# Patient Record
Sex: Male | Born: 1970 | Hispanic: No | Marital: Married | Smoking: Current some day smoker
Health system: Southern US, Community
[De-identification: ages and names within clinical notes are randomized; demographics above are authoritative.]

## PROBLEM LIST (undated history)

## (undated) VITALS — BP 109/74 | HR 75 | Temp 97.6°F | Resp 20 | Ht 73.0 in | Wt 178.0 lb

## (undated) DIAGNOSIS — R208 Other disturbances of skin sensation: Secondary | ICD-10-CM

## (undated) DIAGNOSIS — F329 Major depressive disorder, single episode, unspecified: Secondary | ICD-10-CM

## (undated) DIAGNOSIS — J45909 Unspecified asthma, uncomplicated: Secondary | ICD-10-CM

## (undated) DIAGNOSIS — M79672 Pain in left foot: Secondary | ICD-10-CM

## (undated) DIAGNOSIS — G8929 Other chronic pain: Secondary | ICD-10-CM

## (undated) DIAGNOSIS — F419 Anxiety disorder, unspecified: Secondary | ICD-10-CM

## (undated) DIAGNOSIS — F32A Depression, unspecified: Secondary | ICD-10-CM

## (undated) DIAGNOSIS — L409 Psoriasis, unspecified: Secondary | ICD-10-CM

## (undated) DIAGNOSIS — J302 Other seasonal allergic rhinitis: Secondary | ICD-10-CM

## (undated) DIAGNOSIS — R200 Anesthesia of skin: Secondary | ICD-10-CM

## (undated) HISTORY — DX: Anesthesia of skin: R20.0

## (undated) HISTORY — DX: Other chronic pain: G89.29

## (undated) HISTORY — DX: Psoriasis, unspecified: L40.9

## (undated) HISTORY — PX: HERNIA REPAIR: SHX51

## (undated) HISTORY — DX: Other disturbances of skin sensation: R20.8

## (undated) HISTORY — DX: Pain in left foot: M79.672

## (undated) HISTORY — DX: Other seasonal allergic rhinitis: J30.2

---

## 2002-02-23 ENCOUNTER — Ambulatory Visit (HOSPITAL_COMMUNITY): Admission: RE | Admit: 2002-02-23 | Discharge: 2002-02-23 | Payer: Self-pay | Admitting: Surgery

## 2003-02-08 ENCOUNTER — Encounter: Admission: RE | Admit: 2003-02-08 | Discharge: 2003-02-08 | Payer: Self-pay | Admitting: Surgery

## 2003-02-20 ENCOUNTER — Encounter: Admission: RE | Admit: 2003-02-20 | Discharge: 2003-02-20 | Payer: Self-pay | Admitting: Surgery

## 2005-04-08 IMAGING — CT CT PELVIS W/ CM
1 of 2 series · 15 of 32 positions shown, 19 images · IV contrast (gastro & omnipaque 100)
Comparison: none

CLINICAL DATA: Left abdominal pain ? question kidney stone vs. diverticulitis.
TECHNIQUE: The abdomen was scanned initially without contrast.  Then, the abdomen and pelvis were scanned with contrast (100 cc Omnipaque 300 ? nonionic contrast used because of history of asthma).  Routine delayed renal images were carried out. 
 CT ABDOMEN WITHOUT AND WITH CONTRAST
 No renal calculi.  No hydronephrosis or hydroureter.  Lung bases clear.  Liver, spleen, pancreas, and adrenal glands normal.  No calcified gallstones or biliary dilatation.  No adenopathy or ascites.  
 IMPRESSION
 Normal ? specifically, no evidence for urinary tract calculi.
 CT PELVIS WITH CONTRAST
 There are inflammatory changes of the descending colon near its junction with the sigmoid.  There is marked thickening of the anterior wall of the bowel with stranding of the pericolonic fat.  Within the area of thickening, there is a lucency that could be an early abscess.  However, its Hounsfield units are in the negative range, suggesting that this may just be mesenteric fat surrounded by an inflammatory process.  Most likely diagnosis is diverticulitis, however, I cannot say that I see diverticula elsewhere in the colon.  Nonetheless, that is the most likely diagnosis.  There are no changes elsewhere to suggest inflammatory bowel disease.
 No other focal masses, adenopathy, or fluid collections. 
 Focal inflammatory changes involving the distal descending colon near its junction with the sigmoid.  Most likely diagnosis is diverticulitis.  See above discussion.

[Series 3: — · axial · 0.70mm/px · z∈[-476,-91]mm · 15 of 113 slices shown, 19 images]
[im 5/113  soft-tissue]
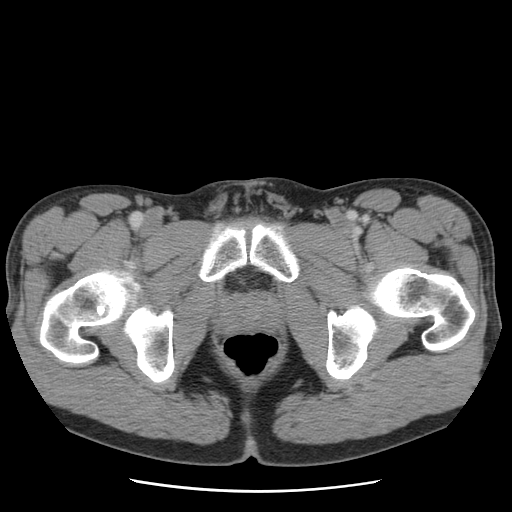
[im 5/113  bone]
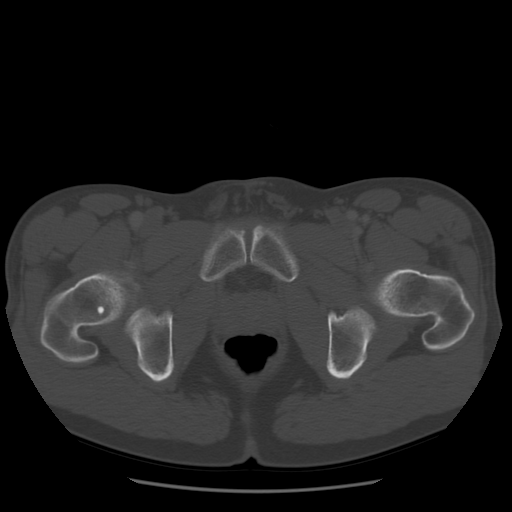
[im 15/113  soft-tissue]
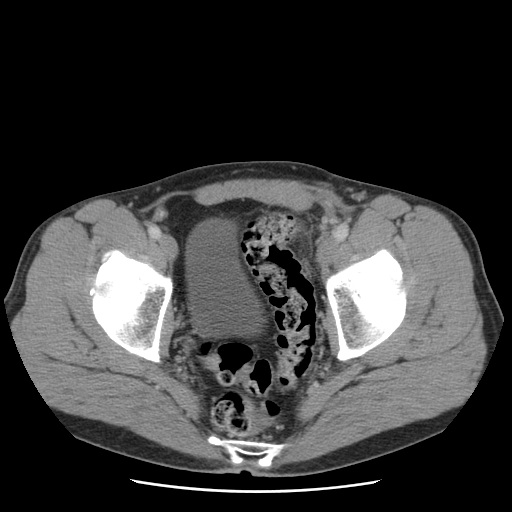
[im 24/113  soft-tissue]
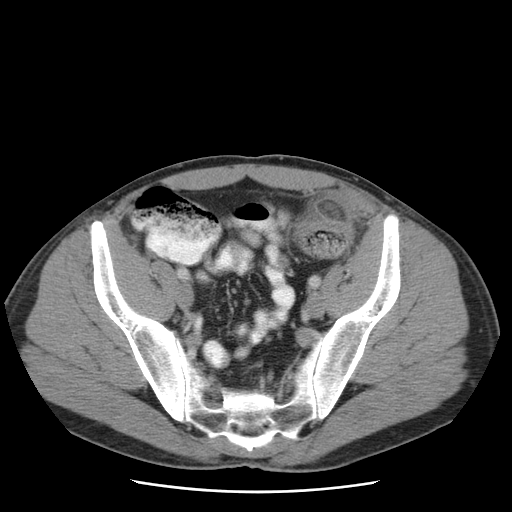
[im 33/113  soft-tissue]
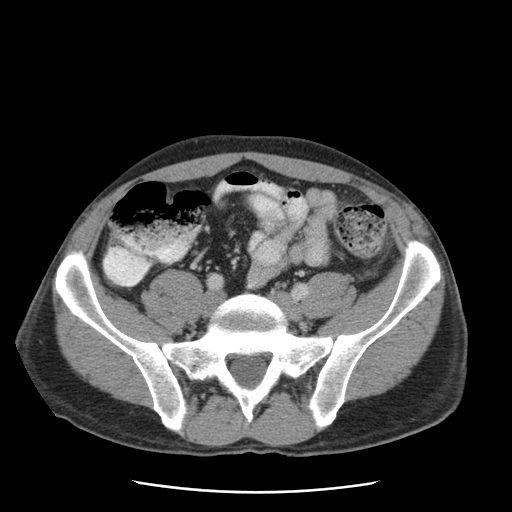
[im 38/113  soft-tissue]
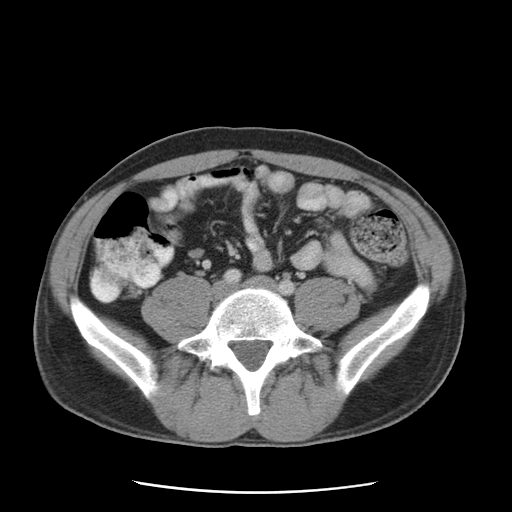
[im 47/113  soft-tissue]
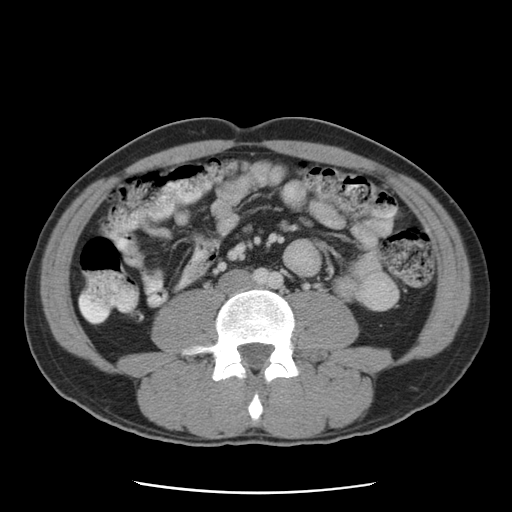
[im 57/113  soft-tissue]
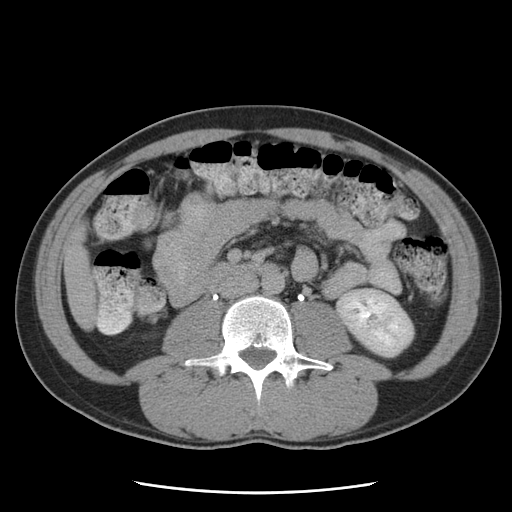
[im 66/113  soft-tissue]
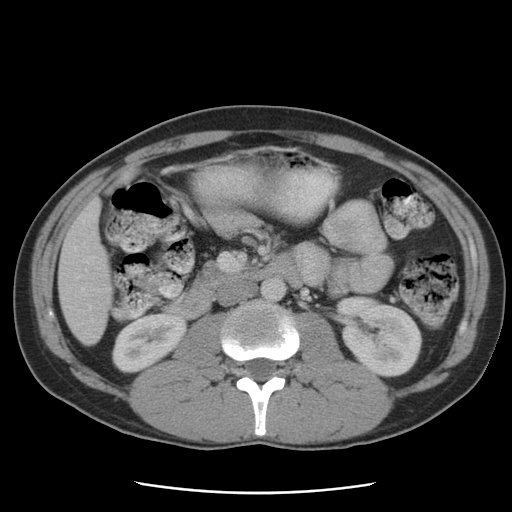
[im 75/113  soft-tissue]
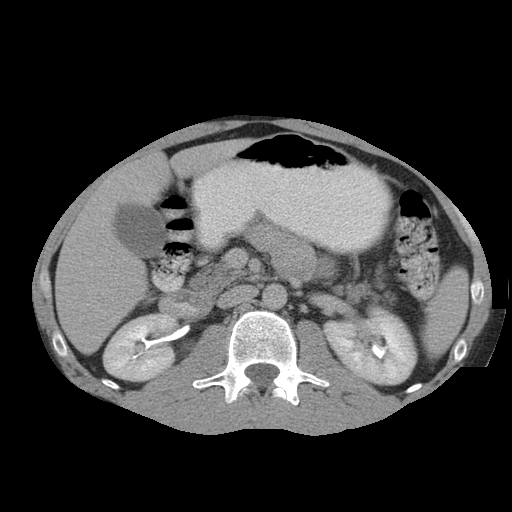
[im 75/113  bone]
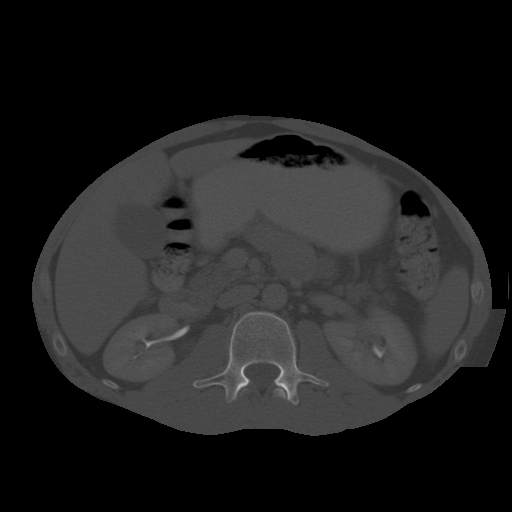
[im 80/113  soft-tissue]
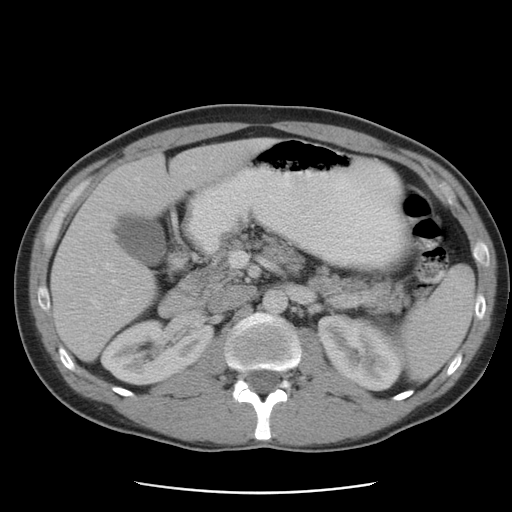
[im 89/113  soft-tissue]
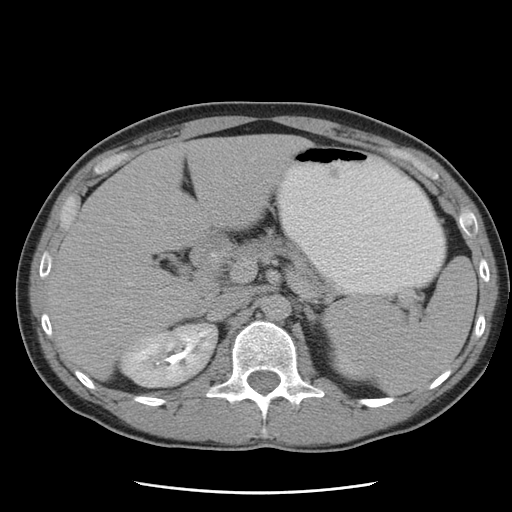
[im 94/113  lung]
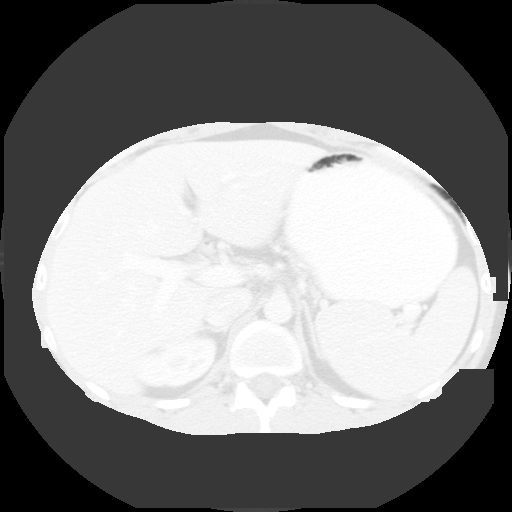
[im 99/113  soft-tissue]
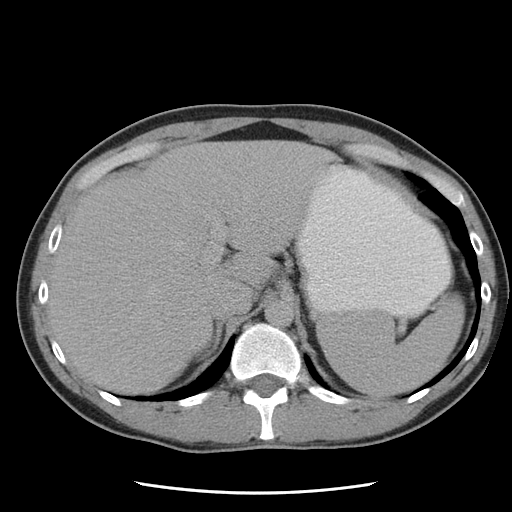
[im 99/113  lung]
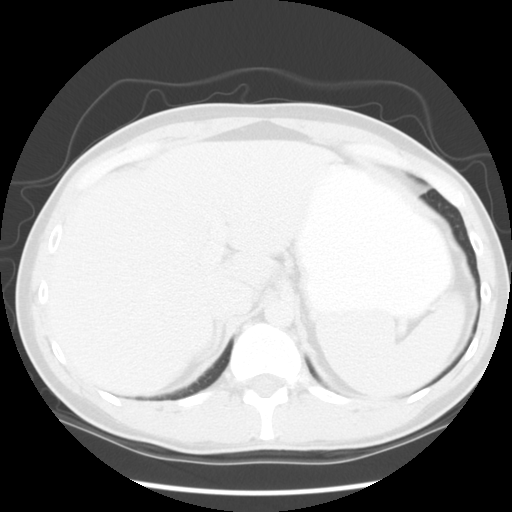
[im 103/113  lung]
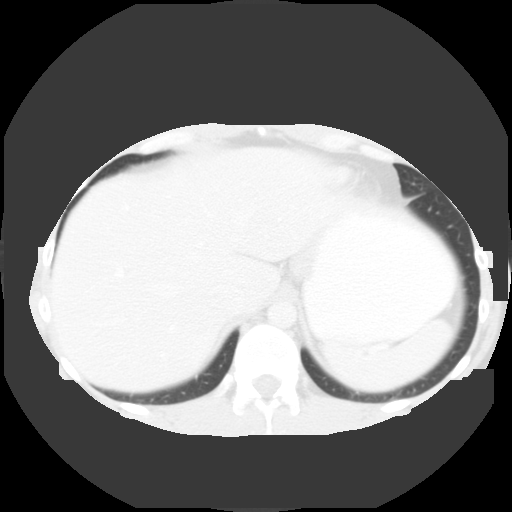
[im 108/113  soft-tissue]
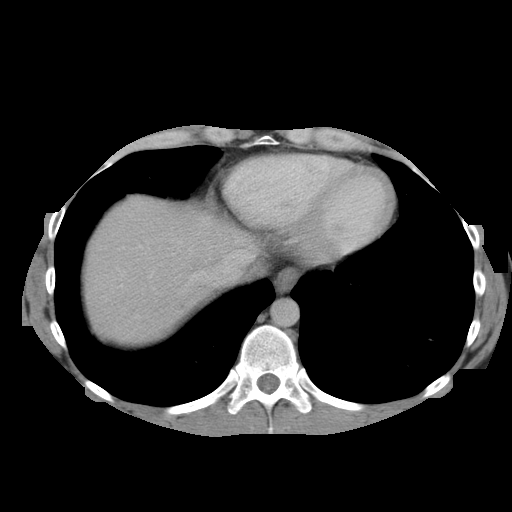
[im 108/113  lung]
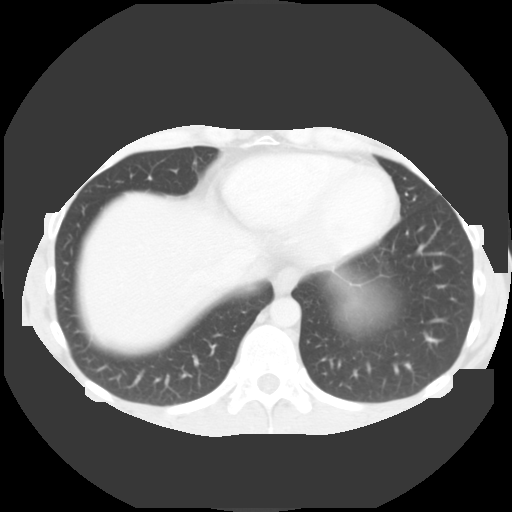

[15 of 32 positions shown; findings below may reference images not displayed]

## 2007-01-09 ENCOUNTER — Emergency Department (HOSPITAL_COMMUNITY): Admission: EM | Admit: 2007-01-09 | Discharge: 2007-01-09 | Payer: Self-pay | Admitting: Emergency Medicine

## 2010-07-26 NOTE — Op Note (Signed)
NAME:  SEQUOYAH, COUNTERMAN                         ACCOUNT NO.:  [id number]   MEDICAL RECORD NO.:  [id number]                   PATIENT TYPE:  AMB   LOCATION:  DAY                                  FACILITY:  [facility]   PHYSICIAN:  Currie Paris, M.D.           DATE OF BIRTH:  1970/11/16   DATE OF PROCEDURE:  02/23/2002  DATE OF DISCHARGE:                                 OPERATIVE REPORT   CCS#:  [id number]   PREOPERATIVE DIAGNOSES:  Left inguinal hernia.   POSTOPERATIVE DIAGNOSES:  Left inguinal hernia--direct.   OPERATION:  Repair direct left inguinal hernia with mesh.   SURGEON:  Currie Paris, M.D.   ANESTHESIA:  MAC.   CLINICAL HISTORY:  This patient is a 40 year old with a left inguinal hernia  that he desired to have repaired.   DESCRIPTION OF PROCEDURE:  The patient was seen in the holding area and he  had already marked the left inguinal area as the side to be done. I  confirmed that and marked it as well.   He was taken to the operating room and after begin given IV sedation, the  left inguinal area was prepped and draped, and 1% Xylocaine was mixed with  0.5% Marcaine with epinephrine and used for local. I infiltrated the skin  incision line and below the anterior superior iliac spine. The incision was  made deep into the external oblique aponeurosis which was opened up in the  line of its fibers and dissected off the underlying structures.   The cord was dissected up off the inguinal floor and there was a fairly  large direct defect present. This was cleaned off the undersurface of the  cord to make sure we did not have any other structures out and that the cord  did not have an indirect sac which it did not.   I took the large mesh plug and trimmed it down a little bit because I  thought this was too bit but the small would be too small. The plug was  placed in the defect and sutured peripherally to the edges of the fascia and  with some 2-0 Prolene. The  patch was then overlaid and sutured with a  running suture starting at the pubic tubercle and running along the inferior  edge of the floor to where it got beyond the deep ring. It was then tacked  medially to the internal oblique going well over the mesh plug and it laid  nicely and was no tension. The tails were crossed going around the cord and  tacked laterally where it lay well lateral to the direct defect.   Everything appeared to be dry. The ilioinguinal nerve was then noted and  preserved and kept with the cord as we worked.   The external oblique was closed over the repair with 3-0 Vicryl, Scarpa's  with 3-0 Vicryl and skin with a running 4-0 Monocryl  subcuticular plus Steri-  Strips. The patient tolerated the procedure well. There were no operative  complications.  All counts were correct.                                               Currie Paris, M.D.    CJS/MEDQ  D:  02/23/2002  T:  02/23/2002  Job:  [id number]

## 2013-03-18 ENCOUNTER — Encounter (HOSPITAL_COMMUNITY): Payer: Self-pay | Admitting: Emergency Medicine

## 2013-03-18 ENCOUNTER — Encounter (HOSPITAL_COMMUNITY): Payer: Self-pay

## 2013-03-18 ENCOUNTER — Emergency Department (HOSPITAL_COMMUNITY): Payer: BC Managed Care – PPO

## 2013-03-18 ENCOUNTER — Emergency Department (HOSPITAL_COMMUNITY)
Admission: EM | Admit: 2013-03-18 | Discharge: 2013-03-18 | Disposition: A | Discharge status: Home / Self Care | Payer: BC Managed Care – PPO | Attending: Emergency Medicine | Admitting: Emergency Medicine

## 2013-03-18 ENCOUNTER — Inpatient Hospital Stay (HOSPITAL_COMMUNITY)
Admission: RE | Admit: 2013-03-18 | Discharge: 2013-03-21 | DRG: 885 | Disposition: A | Discharge status: Home / Self Care | Payer: BC Managed Care – PPO | Attending: Psychiatry | Admitting: Psychiatry

## 2013-03-18 DIAGNOSIS — F329 Major depressive disorder, single episode, unspecified: Secondary | ICD-10-CM | POA: Diagnosis present

## 2013-03-18 DIAGNOSIS — F322 Major depressive disorder, single episode, severe without psychotic features: Secondary | ICD-10-CM

## 2013-03-18 DIAGNOSIS — F4310 Post-traumatic stress disorder, unspecified: Secondary | ICD-10-CM | POA: Diagnosis present

## 2013-03-18 DIAGNOSIS — F418 Other specified anxiety disorders: Secondary | ICD-10-CM

## 2013-03-18 DIAGNOSIS — F411 Generalized anxiety disorder: Secondary | ICD-10-CM | POA: Diagnosis present

## 2013-03-18 DIAGNOSIS — F32A Depression, unspecified: Secondary | ICD-10-CM

## 2013-03-18 DIAGNOSIS — F332 Major depressive disorder, recurrent severe without psychotic features: Principal | ICD-10-CM | POA: Diagnosis present

## 2013-03-18 DIAGNOSIS — F3289 Other specified depressive episodes: Secondary | ICD-10-CM | POA: Insufficient documentation

## 2013-03-18 HISTORY — DX: Major depressive disorder, single episode, unspecified: F32.9

## 2013-03-18 HISTORY — DX: Anxiety disorder, unspecified: F41.9

## 2013-03-18 HISTORY — DX: Depression, unspecified: F32.A

## 2013-03-18 LAB — CBC
HCT: 42.4 % (ref 39.0–52.0)
Hemoglobin: 15.3 g/dL (ref 13.0–17.0)
MCH: 32.5 pg (ref 26.0–34.0)
MCHC: 36.1 g/dL — ABNORMAL HIGH (ref 30.0–36.0)
MCV: 90 fL (ref 78.0–100.0)
Platelets: 213 10*3/uL (ref 150–400)
RBC: 4.71 MIL/uL (ref 4.22–5.81)
RDW: 12 % (ref 11.5–15.5)
WBC: 7.8 10*3/uL (ref 4.0–10.5)

## 2013-03-18 LAB — COMPREHENSIVE METABOLIC PANEL
ALT: 25 U/L (ref 0–53)
AST: 21 U/L (ref 0–37)
Albumin: 4.3 g/dL (ref 3.5–5.2)
Alkaline Phosphatase: 59 U/L (ref 39–117)
BUN: 16 mg/dL (ref 6–23)
CO2: 26 mEq/L (ref 19–32)
Calcium: 9.7 mg/dL (ref 8.4–10.5)
Chloride: 100 mEq/L (ref 96–112)
Creatinine, Ser: 1 mg/dL (ref 0.50–1.35)
GFR calc Af Amer: >90 mL/min (ref >90)
GFR calc non Af Amer: >90 mL/min (ref >90)
Glucose, Bld: 102 mg/dL — ABNORMAL HIGH (ref 70–99)
Potassium: 4.1 mEq/L (ref 3.7–5.3)
Sodium: 138 mEq/L (ref 137–147)
Total Bilirubin: 1.9 mg/dL — ABNORMAL HIGH (ref 0.3–1.2)
Total Protein: 8 g/dL (ref 6.0–8.3)

## 2013-03-18 LAB — RAPID URINE DRUG SCREEN, HOSP PERFORMED
Amphetamines: NOT DETECTED
Barbiturates: NOT DETECTED
Benzodiazepines: NOT DETECTED
Cocaine: NOT DETECTED
Opiates: NOT DETECTED
Tetrahydrocannabinol: POSITIVE — AB

## 2013-03-18 LAB — SALICYLATE LEVEL: Salicylate Lvl: <2 mg/dL — ABNORMAL LOW (ref 2.8–20.0)

## 2013-03-18 LAB — ACETAMINOPHEN LEVEL: Acetaminophen (Tylenol), Serum: <15 ug/mL (ref 10–30)

## 2013-03-18 LAB — ETHANOL: Alcohol, Ethyl (B): <11 mg/dL (ref 0–11)

## 2013-03-18 MED ORDER — ACETAMINOPHEN 325 MG PO TABS: 650.0000 mg | ORAL_TABLET | Freq: Four times a day (QID) | ORAL | Status: DC | PRN

## 2013-03-18 MED ORDER — ALUM & MAG HYDROXIDE-SIMETH 200-200-20 MG/5ML PO SUSP: 30.0000 mL | ORAL | Status: DC | PRN

## 2013-03-18 MED ORDER — TRAZODONE HCL 50 MG PO TABS
50.0000 mg | ORAL_TABLET | Freq: Every day | ORAL | Status: DC
  Administered 2013-03-18 – 2013-03-20 (×3): 50 mg via ORAL
  Filled 2013-03-18 (×5): qty 1

## 2013-03-18 MED ORDER — HYDROXYZINE HCL 25 MG PO TABS: 25.0000 mg | ORAL_TABLET | ORAL | Status: DC | PRN

## 2013-03-18 MED ORDER — MAGNESIUM HYDROXIDE 400 MG/5ML PO SUSP: 30.0000 mL | Freq: Every day | ORAL | Status: DC | PRN

## 2013-03-18 NOTE — [facility] Suicide Risk Assessment (Signed)
[facility] INPATIENT:  Family/Significant Other Suicide Prevention Education  Suicide Prevention Education:  Patient Refusal for Family/Significant Other Suicide Prevention Education: The patient Brent Martin has refused to provide written consent for family/significant other to be provided Family/Significant Other Suicide Prevention Education during admission and/or prior to discharge.  Physician notified.  Wynn BankerHodnett, Brannon Levene Hairston 03/18/2013, 12:28 PM

## 2013-03-18 NOTE — Progress Notes (Signed)
[facility] Group Notes:  (Nursing/MHT/Case Management/Adjunct)  Date:  03/18/2013  Time:  10:52 PM  Type of Therapy:  Group Therapy  Participation Level:  Active  Participation Quality:  Appropriate  Affect:  Appropriate  Cognitive:  Appropriate  Insight:  Appropriate  Engagement in Group:  Engaged  Modes of Intervention:  Socialization and Support  Summary of Progress/Problems: Pt. Stated he had a good day because he talked with his sister over the phone.  Pt. Stated he was starting a new medication, so look forward to giving and receiving feedback.  Sondra ComeWilson, Ein Rijo J 03/18/2013, 10:52 PM
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
## 2013-03-18 NOTE — ED Notes (Signed)
Pt changed to paper scrubs. Pt's belongings (2 bags) at  The triage nursing station.
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
## 2013-03-18 NOTE — Tx Team (Signed)
Initial Interdisciplinary Treatment Plan  PATIENT STRENGTHS: (choose at least two) Ability for insight Active sense of humor Average or above average intelligence Capable of independent living Communication skills Financial means General fund of knowledge Motivation for treatment/growth Physical Health Work skills  PATIENT STRESSORS: Legal issue Loss of wife due to restraining order Marital or family conflict   PROBLEM LIST: Problem List/Patient Goals Date to be addressed Date deferred Reason deferred Estimated date of resolution  Depression with SI 03/18/13     Anxiety 03/18/13                                                DISCHARGE CRITERIA:  Ability to meet basic life and health needs Adequate post-discharge living arrangements Improved stabilization in mood, thinking, and/or behavior Verbal commitment to aftercare and medication compliance  PRELIMINARY DISCHARGE PLAN: Outpatient therapy Placement in alternative living arrangements  PATIENT/FAMIILY INVOLVEMENT: This treatment plan has been presented to and reviewed with the patient, Brent Martin, and/or family member, .  The patient and family have been given the opportunity to ask questions and make suggestions.  Manuela Schwartzritchett, Young Brim [facility] 03/18/2013, 10:32 AM
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
## 2013-03-18 NOTE — Progress Notes (Signed)
Recreation Therapy Notes  Date: 01.09.2015 Time: 2:45pm Location: [address] Dayroom   Group Topic: Stress Management  Goal Area(s) Addresses:  Patient will verbalize importance of using healthy stress management.  Patient will complete stress management techniques taught in group session.   Behavioral Response: Engaged, Distracted,  Intervention: Deep Breathing & Progressive Muscle Relaxation   Activity:  Deep Breathing - LRT instructed and had patients practice belly breathing. Progressive Muscle Relaxation - LRT instructed patients on completing progressive muscle relaxation.   Education:  Stress Management, Coping Skills, Discharge Planning.   Education Outcome: Acknowledges understanding  Clinical Observations/Feedback: Patient completed both deep breathing and progressive muscle relaxation. Patient shared he used to run for stress relief, but has found excuses recently not to participate in this activity. Patient offered support to patient and encouraged him to investigate alternate options, such as deep breathing or progressive muscle relaxation. Patient verbalized understanding of benefit of healthy stress management, but expressed apprehension about execution.    Marykay Lexenise L Lateia Fraser, LRT/CTRS  Jyren Cerasoli L 03/18/2013 5:05 PM

## 2013-03-18 NOTE — Progress Notes (Signed)
Adult Psychoeducational Group Note  Date:  03/18/2013 Time:  1:34 PM  Group Topic/Focus:  Healthy Communication:   The focus of this group is to discuss communication, barriers to communication, as well as healthy ways to communicate with others.  Participation Level:  Active  Participation Quality:  Appropriate  Affect:  Appropriate and Excited  Cognitive:  Appropriate  Insight: Appropriate  Engagement in Group:  Engaged  Modes of Intervention:  Activity and Socialization  Additional Comments:   Pt attended group and was engaged in the activity. The activity was charades.   Bernyce Brimley P 03/18/2013, 1:34 PM
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
## 2013-03-18 NOTE — [facility] Group Notes (Signed)
[facility] LCSW Group Therapy  Feelings Around Relapse 1:15 -2:30        03/18/2013 3:59 PM   Type of Therapy:  Group Therapy  Participation Level:  Appropriate  Participation Quality:  Appropriate  Affect:  Appropriate  Cognitive:  Attentive Appropriate  Insight:  Developing/Improving  Engagement in Therapy: Developing/Improving  Modes of Intervention:  Discussion Exploration Problem-Solving Supportive  Summary of Progress/Problems:  The topic for today was feelings around relapse.    Patient processed feelings toward relapse and was able to relate to peers. He shared relapsing for him would be acting without taking time to listen.  Patient identified coping skills that can be used to prevent a relapse.   Wynn BankerHodnett, Osamu Olguin Hairston 03/18/2013  3:59 PM
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
## 2013-03-19 DIAGNOSIS — F329 Major depressive disorder, single episode, unspecified: Secondary | ICD-10-CM

## 2013-03-19 NOTE — Progress Notes (Signed)
[facility] MD Progress Note  03/19/2013 2:05 PM Brent Martin  MRN:  [id number] Subjective: Patient is a 43yrold male that was voluntary admitted for depression. He states he is feeling pretty good. He states he is doing better because his moods and thoughts have changed, he is now more positive about everything.Reports he wanted to come here to get help for his kids. He reports sleeping well about 7-8 hours at night, and decent appetite. Denies any depression and or anxiety. Denies any SI/Hi/AVH. He is attending gorups, and is requesting to go outside today stating that his endorphins need fresh air, being couped up inside all day does something to him.    Diagnosis:   DSM5: Schizophrenia Disorders:   Obsessive-Compulsive Disorders:   Trauma-Stressor Disorders:  Posttraumatic Stress Disorder (309.81) Substance/Addictive Disorders:  Cannabis Use Disorder - Moderate 9304.30) Depressive Disorders:  Major Depressive Disorder - Moderate (296.22)  Axis II: Deferred Axis III:  Past Medical History  Diagnosis Date  . Medical history non-contributory   . Anxiety   . Depression    Axis IV: economic problems, problems related to social environment and problems with primary support group Axis V: 41-50 serious symptoms Anxiety Disorder NOS, Depressive Disorder NOS, Post Traumatic Stress Disorder and Substance Abuse  ADL's:  Intact  Sleep: Good  Appetite:  Good  Suicidal Ideation:  Plan:  Denies Intent:  Denies Means:  Denies Homicidal Ideation:  Plan:  DEnies Intent:  Denies Means:  Denies AEB (as evidenced by):  Psychiatric Specialty Exam: Review of Systems  Eyes: Positive for redness.    Blood pressure 124/80, pulse 65, temperature 97.6 F (36.4 C), temperature source Oral, resp. rate 16, height '6\' 1"'  (1.854 m), weight 80.74 kg (178 lb).Body mass index is 23.49 kg/(m^2).  General Appearance: Casual and Fairly Groomed  EEngineer, water:  Fair  Speech:  Clear and Coherent  Volume:   Normal  Mood:  Euthymic  Affect:  Depressed and Flat  Thought Process:  Coherent, Goal Directed and Linear  Orientation:  Full (Time, Place, and Person)  Thought Content:  WDL  Suicidal Thoughts:  No  Homicidal Thoughts:  No  Memory:  Immediate;   Good Recent;   Good Remote;   Good  Judgement:  Intact  Insight:  Good  Psychomotor Activity:  Normal  Concentration:  Fair  Recall:  Fair  Akathisia:  No  Handed:  Right  AIMS (if indicated):     Assets:  Communication Skills Desire for Improvement Physical Health Talents/Skills  Sleep:  Number of hours 7-8   Current Medications: Current Facility-Administered Medications  Medication Dose Route Frequency Provider Last Rate Last Dose  . acetaminophen (TYLENOL) tablet 650 mg  650 mg Oral Q6H PRN JWaylan Boga NP      . alum & mag hydroxide-simeth (MAALOX/MYLANTA) 200-200-20 MG/5ML suspension 30 mL  30 mL Oral Q4H PRN JWaylan Boga NP      . hydrOXYzine (ATARAX/VISTARIL) tablet 25 mg  25 mg Oral Q4H PRN JWaylan Boga NP      . magnesium hydroxide (MILK OF MAGNESIA) suspension 30 mL  30 mL Oral Daily PRN JWaylan Boga NP      . traZODone (DESYREL) tablet 50 mg  50 mg Oral QHS JWaylan Boga NP   50 mg at 03/18/13 2143    Lab Results:  Results for orders placed during the hospital encounter of 03/18/13 (from the past 48 hour(s))  ACETAMINOPHEN LEVEL     Status: None   Collection Time  03/18/13  3:48 AM      Result Value Range   Acetaminophen (Tylenol), Serum <15.0  10 - 30 ug/mL   Comment:            THERAPEUTIC CONCENTRATIONS VARY     SIGNIFICANTLY. A RANGE OF 10-30     ug/mL MAY BE AN EFFECTIVE     CONCENTRATION FOR MANY PATIENTS.     HOWEVER, SOME ARE BEST TREATED     AT CONCENTRATIONS OUTSIDE THIS     RANGE.     ACETAMINOPHEN CONCENTRATIONS     >150 ug/mL AT 4 HOURS AFTER     INGESTION AND >50 ug/mL AT 12     HOURS AFTER INGESTION ARE     OFTEN ASSOCIATED WITH TOXIC     REACTIONS.  CBC     Status: Abnormal    Collection Time    03/18/13  3:48 AM      Result Value Range   WBC 7.8  4.0 - 10.5 K/uL   RBC 4.71  4.22 - 5.81 MIL/uL   Hemoglobin 15.3  13.0 - 17.0 g/dL   HCT 42.4  39.0 - 52.0 %   MCV 90.0  78.0 - 100.0 fL   MCH 32.5  26.0 - 34.0 pg   MCHC 36.1 (*) 30.0 - 36.0 g/dL   RDW 12.0  11.5 - 15.5 %   Platelets 213  150 - 400 K/uL  COMPREHENSIVE METABOLIC PANEL     Status: Abnormal   Collection Time    03/18/13  3:48 AM      Result Value Range   Sodium 138  137 - 147 mEq/L   Potassium 4.1  3.7 - 5.3 mEq/L   Chloride 100  96 - 112 mEq/L   CO2 26  19 - 32 mEq/L   Glucose, Bld 102 (*) 70 - 99 mg/dL   BUN 16  6 - 23 mg/dL   Creatinine, Ser 1.00  0.50 - 1.35 mg/dL   Calcium 9.7  8.4 - 10.5 mg/dL   Total Protein 8.0  6.0 - 8.3 g/dL   Albumin 4.3  3.5 - 5.2 g/dL   AST 21  0 - 37 U/L   ALT 25  0 - 53 U/L   Alkaline Phosphatase 59  39 - 117 U/L   Total Bilirubin 1.9 (*) 0.3 - 1.2 mg/dL   GFR calc non Af Amer >90  >90 mL/min   GFR calc Af Amer >90  >90 mL/min   Comment: (NOTE)     The eGFR has been calculated using the CKD EPI equation.     This calculation has not been validated in all clinical situations.     eGFR's persistently <90 mL/min signify possible Chronic Kidney     Disease.  ETHANOL     Status: None   Collection Time    03/18/13  3:48 AM      Result Value Range   Alcohol, Ethyl (B) <11  0 - 11 mg/dL   Comment:            LOWEST DETECTABLE LIMIT FOR     SERUM ALCOHOL IS 11 mg/dL     FOR MEDICAL PURPOSES ONLY  SALICYLATE LEVEL     Status: Abnormal   Collection Time    03/18/13  3:48 AM      Result Value Range   Salicylate Lvl <0.2 (*) 2.8 - 20.0 mg/dL  URINE RAPID DRUG SCREEN (HOSP PERFORMED)     Status: Abnormal  Collection Time    03/18/13  3:54 AM      Result Value Range   Opiates NONE DETECTED  NONE DETECTED   Cocaine NONE DETECTED  NONE DETECTED   Benzodiazepines NONE DETECTED  NONE DETECTED   Amphetamines NONE DETECTED  NONE DETECTED   Tetrahydrocannabinol  POSITIVE (*) NONE DETECTED   Barbiturates NONE DETECTED  NONE DETECTED   Comment:            DRUG SCREEN FOR MEDICAL PURPOSES     ONLY.  IF CONFIRMATION IS NEEDED     FOR ANY PURPOSE, NOTIFY LAB     WITHIN 5 DAYS.                LOWEST DETECTABLE LIMITS     FOR URINE DRUG SCREEN     Drug Class       Cutoff (ng/mL)     Amphetamine      1000     Barbiturate      200     Benzodiazepine   132     Tricyclics       440     Opiates          300     Cocaine          300     THC              50    Physical Findings: AIMS: Facial and Oral Movements Muscles of Facial Expression: None, normal Lips and Perioral Area: None, normal Jaw: None, normal Tongue: None, normal,Extremity Movements Upper (arms, wrists, hands, fingers): None, normal Lower (legs, knees, ankles, toes): None, normal, Trunk Movements Neck, shoulders, hips: None, normal, Overall Severity Severity of abnormal movements (highest score from questions above): None, normal Incapacitation due to abnormal movements: None, normal Patient's awareness of abnormal movements (rate only patient's report): No Awareness, Dental Status Current problems with teeth and/or dentures?: No Does patient usually wear dentures?: No  CIWA:    COWS:     Treatment Plan Summary: Daily contact with patient to assess and evaluate symptoms and progress in treatment Medication management  Treatment Plan/Recommendations: Plan: Review of chart, vital signs, medications, and notes.  1-Admit for crisis management and stabilization. Estimated length of stay 5-7 days past his current stay of 1  2-Individual and group therapy encouraged  3-Medication management for depression and anxiety to reduce current symptoms to base line and improve the patient's overall level of functioning: Medications reviewed with the patient and Trazodone added for sleep issues and Vistaril 25 mg every six hours PRN anxiety  4-Coping skills for depression, substance abuse, anger  issues, and anxiety developing--  5-Continue crisis stabilization and management  6-Address health issues--monitoring vital signs, stable  7-Treatment plan in progress to prevent relapse of depression, angry outbursts, and anxiety  8-Psychosocial education regarding relapse prevention and self-care  9-Health care follow up as needed for any health concerns  10-Call for consult with hospitalist for additional specialty patient services as needed.   Medical Decision Making Problem Points:  Established problem, stable/improving (1), Review of last therapy session (1) and Review of psycho-social stressors (1) Data Points:  Review or order clinical lab tests (1) Review or order medicine tests (1) Review of medication regiment & side effects (2) Review of new medications or change in dosage (2)  I certify that inpatient services furnished can reasonably be expected to improve the patient's condition.   Priscille Loveless S 03/19/2013, 2:05 PM

## 2013-03-19 NOTE — Progress Notes (Signed)
[facility] Group Notes:  (Nursing/MHT/Case Management/Adjunct)  Date:  03/19/2013  Time:  9:39 PM  Type of Therapy:  Group Therapy  Participation Level:  Active  Participation Quality:  Appropriate  Affect:  Appropriate  Cognitive:  Appropriate  Insight:  Appropriate  Engagement in Group:  Engaged  Modes of Intervention:  Discussion  Summary of Progress/Problems:The pt expressed that his peers body language help him most today. That because the look happy that made him happy.  Octavio Mannshigpen, Melvern Ramone Lee 03/19/2013, 9:39 PM

## 2013-03-19 NOTE — [facility] Group Notes (Signed)
[facility] Group Notes:  (Clinical Social Work)  @DATE @    3:00-4:00PM  Summary of Progress/Problems:   The main focus of today's process group was for the patient to identify ways in which they have sabotaged their own mental health wellness/recovery.  Motivational interviewing was used to explore the reasons they engage in this behavior, and reasons they may have for wanting to change.  Scaling of 1 (no motivation) to 10 (complete motivation) was used for the patient to identify where they are with regard to changing the self-defeating behavior discussed by them in group today.  The patient expressed that he self-sabotages by isolation and withdrawal, saying he can do that even when in the same room as his family.  He feels ready to change (10/10) but lacks confidence at his ability to change, not based on himself but on his wife.  He states that she knows what buttons to push to make him angry, and does it consistently.  He stated there are only two options, to either react to her in exploding or to withdraw.  CSW challenged him that there are some other options available as well, and he did not feel he had said there were only two.  The patient then talked to the group about not believing in medication, and said belief in God and the Bible is all that is required for recovery.  CSW asked his permission to respectfully disagree, and gave the example of diabetes.  He stated that he feels irritated when he "gets it" when other people don't.  He stated his wife had been looking up information on the internet and told him that he is a Health and safety inspectorsociopath.  CSW emphasized that it is not really necessary to focus on what something is called, but rather to find out what will help, whether that is medication, having a counselor to talk to, an activity to pursue, or whatever.  He did not respond, but the other patients indicated an understanding of this.  Type of Therapy:  Process Group  Participation Level:   Active  Participation Quality:  Attentive, Resistant and Sharing  Affect:  Blunted, Defensive and Depressed  Cognitive:  Oriented  Insight:  Limited  Engagement in Therapy:  Engaged  Modes of Intervention:  Education, Motivational Interviewing   Ambrose MantleMareida Grossman-Orr, LCSW 03/19/2013, 4:00pm

## 2013-03-19 NOTE — Progress Notes (Signed)
Psychoeducational Group Note  Date: 03/19/2013 Time:  1015  Group Topic/Focus:  Identifying Needs:   The focus of this group is to help patients identify their personal needs that have been historically problematic and identify healthy behaviors to address their needs.  Participation Level:  Active  Participation Quality:  Appropriate  Affect:  Appropriate  Cognitive:  Oriented  Insight:  Improving  Engagement in Group:  Engaged  Additional Comments:  Pt attended the group and partisipated.  Gerrick Ray A 

## 2013-03-19 NOTE — Progress Notes (Signed)
Patient ID: Margo CommonStephen E Yeomans, male   DOB: 05/01/1970, 43 y.o.   MRN: [id number] D)  Has been out and about on the hall this evening, attended group, went back to his room for a short time but came back to watch the football game.  Stood in the hall to watch the game but didn't want to go in and sit down.  Felt was doing OK, trying to work things out, but answers were brief.  A)  Will continue to monitor for safety, continue POC R)  Safety maintained.

## 2013-03-19 NOTE — Progress Notes (Signed)
Psychoeducational Group Note  Date: 03/19/2013  Time: 0930  Goal Setting  Purpose of Group: To be able to set a goal that is measurable and that can be accomplished in one day  Participation Level: Active  Participation Quality: Appropriate  Affect: Appropriate  Cognitive: Oriented  Insight: Improving  Engagement in Group: Engaged  Additional Comments: Pt attended the the group and participated.  Brent Martin A      
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
## 2013-03-20 DIAGNOSIS — F411 Generalized anxiety disorder: Secondary | ICD-10-CM

## 2013-03-20 NOTE — Progress Notes (Signed)
Psychoeducational Group Note  Date: 03/20/2013 Time:  0930  Group Topic/Focus:  Gratefulness:  The focus of this group is to help patients identify what two things they are most grateful for in their lives. What helps ground them and to center them on their work to their recovery.  Participation Level:  Active  Participation Quality:  Appropriate  Affect:  Appropriate  Cognitive:  Oriented  Insight:  Improving  Engagement in Group:  Engaged  Additional Comments:  Pt identified his family and his children as what gives him strength and sustains him.  Dione HousekeeperJudge, Mlissa Tamayo A

## 2013-03-20 NOTE — Progress Notes (Signed)
[facility] Group Notes:  (Nursing/MHT/Case Management/Adjunct)  Date:  03/20/2013  Time:  9:36 PM  Type of Therapy:  Group Therapy  Participation Level:  Active  Participation Quality:  Appropriate  Affect:  Appropriate  Cognitive:  Appropriate  Insight:  Appropriate  Engagement in Group:  Engaged  Modes of Intervention:  Discussion  Summary of Progress/Problems:th pt expressed that  He felt laughter was healthy therapy.  Octavio Mannshigpen, Asami Lambright Lee 03/20/2013, 9:36 PM

## 2013-03-20 NOTE — [facility] Group Notes (Signed)
[facility] Group Notes:  (Clinical Social Work)  03/20/2013   3-4PM  Summary of Progress/Problems:  The main focus of today's process group was to   identify the patient's current support system and decide on other supports that can be put in place.  The picture on workbook was used to discuss why additional supports are needed.  An emphasis was placed on using counselor, doctor, therapy groups, 12-step groups, and problem-specific support groups to expand supports.   There was also an extensive discussion about what constitutes a healthy support versus an unhealthy support.  The patient expressed full comprehension of the concepts presented.  He participated fully in group and talked about outdoors time and yard work, playing with the children, as ways he copes with some of his feelings.  He does not really feel a necessity to talk to a counselor.  Type of Therapy:  Process Group  Participation Level:  Active  Participation Quality:  Attentive and Sharing  Affect:  Blunted  Cognitive:  Appropriate and Oriented  Insight:  Engaged  Engagement in Therapy:  Engaged  Modes of Intervention:  Education,  Support and ConAgra FoodsProcessing  Jaire Pinkham Grossman-Orr, LCSW 03/20/2013, 4:00pm

## 2013-03-20 NOTE — Progress Notes (Signed)
Psychoeducational Group Note  Date:  03/20/2013 Time:  1015  Group Topic/Focus:  Making Healthy Choices:   The focus of this group is to help patients identify negative/unhealthy choices they were using prior to admission and identify positive/healthier coping strategies to replace them upon discharge.  Participation Level:  Active  Participation Quality:  Attentive  Affect:  Appropriate  Cognitive:  Oriented  Insight:  limited  Engagement in Group:  Engaged  Additional Comments:  Pt attends group and listensm, but will frequently joke inappropriately during the group.  Dione HousekeeperJudge, Latoya Maulding A 03/20/2013

## 2013-03-20 NOTE — Progress Notes (Signed)
Seen and agreed. Alexah Kivett, MD 
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
## 2013-03-20 NOTE — Progress Notes (Signed)
Adult Psychoeducational Group Note  Date:  03/20/2013 Time:  2:25 PM  Group Topic/Focus:  Therapeutic Activity   Participation Level:  Active  Participation Quality:  Appropriate and Attentive  Affect:  Appropriate  Cognitive:  Appropriate  Insight: Appropriate  Engagement in Group:  Engaged  Modes of Intervention:  Activity   Elijio MilesMercer, Canon Gola N 03/20/2013, 2:25 PM

## 2013-03-20 NOTE — Progress Notes (Signed)
D) Pt has attended the groups and interacts with his peers. When in group, Pt will frequently joke around or make light of a serious topic when it is being discussed. Appears to be limited in his insight about his situation and the part he played in it. Today in group Pt. Verbalized that everyone manipulates others to get what they want. Rates his depression and his hopelessness both at a  .5 and denies SI and HI. A) Given support when appropriate. Gentle confrontation used with Pt in talking about his situation and the roll he playe in his situation.  R) Has attended the groups and has been out in the milieu

## 2013-03-21 MED ORDER — TRAZODONE HCL 50 MG PO TABS: 50.0000 mg | ORAL_TABLET | Freq: Every day | ORAL | Status: DC

## 2013-03-21 MED ORDER — HYDROXYZINE HCL 25 MG PO TABS: 25.0000 mg | ORAL_TABLET | ORAL | Status: DC | PRN

## 2013-03-21 NOTE — [facility] Suicide Risk Assessment (Signed)
Suicide Risk Assessment  Discharge Assessment     Demographic Factors:  Male, Adolescent or young adult and Caucasian  Mental Status Per Nursing Assessment::   On Admission:  Self-harm thoughts  Current Mental Status by Physician: Mental Status Examination: Patient appeared as per his stated age, casually dressed, and fairly groomed, and maintaining good eye contact. Patient has good mood and his affect was constricted. He has normal rate, rhythm, and volume of speech. His thought process is linear and goal directed. Patient has denied suicidal, homicidal ideations, intentions or plans. Patient has no evidence of auditory or visual hallucinations, delusions, and paranoia. Patient has fair insight judgment and impulse control.  Loss Factors: Loss of significant relationship  Historical Factors: NA  Risk Reduction Factors:   Sense of responsibility to family, Religious beliefs about death, Living with another person, especially a relative, Positive social support, Positive therapeutic relationship and Positive coping skills or problem solving skills  Continued Clinical Symptoms:  Depression:   Recent sense of peace/wellbeing Previous Psychiatric Diagnoses and Treatments  Cognitive Features That Contribute To Risk:  Polarized thinking    Suicide Risk:  Minimal: No identifiable suicidal ideation.  Patients presenting with no risk factors but with morbid ruminations; may be classified as minimal risk based on the severity of the depressive symptoms  Discharge Diagnoses:   AXIS I:  Anxiety Disorder NOS and Major Depression, single episode AXIS II:  Deferred AXIS III:   Past Medical History  Diagnosis Date  . Medical history non-contributory   . Anxiety   . Depression    AXIS IV:  other psychosocial or environmental problems, problems related to social environment and problems with primary support group AXIS V:  61-70 mild symptoms  Plan Of Care/Follow-up recommendations:   Activity:  As tolerated Diet:  Regular  Is patient on multiple antipsychotic therapies at discharge:  No   Has Patient had three or more failed trials of antipsychotic monotherapy by history:  No  Recommended Plan for Multiple Antipsychotic Therapies: NA  Rashonda Warrior,JANARDHAHA R. 03/21/2013, 12:29 PM

## 2013-03-21 NOTE — Progress Notes (Signed)
Patient ID: Margo CommonStephen E Ream, male   DOB: 01/14/1971, 43 y.o.   MRN: [id number] Patient was discharged ambulatory to drive himself home in his truck that is parked outside.  He denies SI/HI.  He verbalizes understanding of his discharge meds and followup.  He is planning to live with his sister,  [facility]  He is hopeful that he and his wife can work things out.

## 2013-03-21 NOTE — Progress Notes (Signed)
Patient ID: Brent CommonStephen E Porchia, male   DOB: 07/29/1970, 43 y.o.   MRN: [id number] D)  Has been out and about on the hall this evening, seems superficially bright with staff and peers this evening, attended group, stated he has had a good day, feels being able to laugh with others is uplifting and helps you diminish your problem,  But doesn't seem to be addressing his own issues.  Did say he appreciates the support he has found here and that is shared with the group. A)  Will continue to monitor for safety, continue POC R)  Safety maintained.

## 2013-03-21 NOTE — Progress Notes (Signed)
Recreation Therapy Notes  INPATIENT RECREATION THERAPY ASSESSMENT  Patient Stressors:  Relationship,  Coping Skills:  Arguments,  Avoidance,   Leisure Interests: Arts Chief [employer] Staff[employer] Crafts,  Exercise, Gardening, Sports, Walking,   Personal Challenges: Anger, Communication, Stress Management,   Patient indicated he does not have a physical disability that would prevent participating in recreation therapy group sessions.  Patient listed the following strengths: NONE LISTED  Patient indicated he would like to change the following about himself: NONE LISTED  Patient listed the following current recreation interests: Work in the yard        Patient goal for hospitalization: "Not sure."  Patient city [place] county of residence: [place], [place]  Brent Martin, Brent Martin  Brent Martin, Brent Martin 03/21/2013 12:15 PM

## 2013-03-21 NOTE — Progress Notes (Signed)
[name] :  Will you be returning to the same living situation after discharge: No.  Patient exploring options as to where he will live. At discharge, do you have transportation home?:Yes,  patient has transportation home. Do you have the ability to pay for your medications:Yes,  Patient is able to afford medications.  Release of information consent forms completed and in the chart;  Patient's signature needed at discharge.  Patient to Follow up at: Follow-up Information   Follow up with Boneta LucksJennifer Brown -   [facility] On [facility]. Boneta Lucks(Jennifer Brown at [place]8AM - Please arrive at 7:45 with completed registration packet.  If they are not there, please do not leave as someone will be open the door momentatily.)    Contact information:   [address] [place],  [place]   [id number]  [phone]      Follow up with Dr. Lolly MustacheArfeen - [facility] On 04/12/2013. (Tuesday April 12, 2013 at 1:30 PM with Dr. Lolly MustacheArfeen)    Contact information:   [address]  [place]   [id number]  [phone]      Patient denies SI/HI:  Patient no longer endorsing SI/HI or other thoughts of self harm.     Safety Planning and Suicide Prevention discussed:  .Reviewed with all patients during discharge planning group  Brent Martin, Brent Martin 03/21/2013, 10:21 AM
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
## 2013-03-21 NOTE — Tx Team (Addendum)
Interdisciplinary Treatment Plan Update   Date Reviewed:  03/21/2013  Time Reviewed:  8:42 AM  Progress in Treatment:   Attending groups: Yes Participating in groups: Yes Taking medication as prescribed: Yes  Tolerating medication: Yes Family/Significant other contact made: [name] declined collateral contact Patient understands diagnosis: Yes  Discussing patient identified problems/goals with staff: Yes Medical problems stabilized or resolved: Yes Denies suicidal/homicidal ideation: Yes Patient has not harmed self or others: Yes  For review of initial/current patient goals, please see plan of care.  Estimated Length of Stay:  Discharge home today  Reasons for Continued Hospitalization:   New Problems/Goals identified:    Discharge Plan or Barriers:   Home with outpatient [facility]  Additional Comments: N/A  Attendees:  Patient:  Brent RavelStephen Birt  03/21/2013 8:42 AM   Signature: Mervyn GayJ. Jonnalagadda, MD 03/21/2013 8:42 AM  Signature:   Claudette Headonrad Withrow, NP  03/21/2013 8:42 AM  Signature:   Marzetta Boardhrista Dopson, RN 03/21/2013 8:42 AM  Signature:Beverly Terrilee CroakKnight, RN 03/21/2013 8:42 AM  Signature:  Neill Loftarol Davis RN 03/21/2013 8:42 AM  Signature:  Juline PatchQuylle Vasil Juhasz, LCSW 03/21/2013 8:42 AM  Signature:  Reyes Ivanhelsea Horton, LCSW 03/21/2013 8:42 AM  Signature:  Leisa LenzValerie Enoch, Care Coordinator 03/21/2013 8:42 AM  Signature:   03/21/2013 8:42 AM  Signature:  03/21/2013  8:42 AM  Signature:   Onnie BoerJennifer Clark, RN [facility] 03/21/2013  8:42 AM  Signature: 03/21/2013  8:42 AM    Scribe for Treatment Team:   Juline PatchQuylle Bernise Sylvain,  03/21/2013 8:42 AM

## 2013-03-21 NOTE — [facility] Group Notes (Signed)
[facility] Dis[facility] Note   03/21/2013 9:40 AM    Participation Quality:  Appropraite  Mood/Affect:  Appropriate  Depression Rating:  0  Anxiety Rating:  0  Thoughts of Suicide:  No  Will you contract for safety?   NA  Current AVH:  No  Plan for Discharge/Comments:  Patient attended discharge planning group and actively participated in group. [facility]  CSW provided all participants with daily workbook.   Transportation Means: Patient has transportation.   Supports:  Patient has a support system.   Alyss Granato, Joesph JulyQuylle Hairston

## 2013-03-22 ENCOUNTER — Ambulatory Visit (INDEPENDENT_AMBULATORY_CARE_PROVIDER_SITE_OTHER): Payer: BC Managed Care – PPO | Admitting: Psychiatry

## 2013-03-22 ENCOUNTER — Encounter (HOSPITAL_COMMUNITY): Payer: Self-pay | Admitting: Psychiatry

## 2013-03-22 DIAGNOSIS — F3289 Other specified depressive episodes: Secondary | ICD-10-CM

## 2013-03-22 DIAGNOSIS — F329 Major depressive disorder, single episode, unspecified: Secondary | ICD-10-CM
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
## 2013-03-24 NOTE — Progress Notes (Signed)
Patient Discharge Instructions:  Next Level Care Provider Has Access to the EMR, 03/24/13 [facility] via [facility]/Epic access.  [name], 03/24/2013, 2:03 PM

## 2013-04-12 ENCOUNTER — Ambulatory Visit (INDEPENDENT_AMBULATORY_CARE_PROVIDER_SITE_OTHER): Payer: BC Managed Care – PPO | Admitting: Psychiatry

## 2013-04-12 ENCOUNTER — Encounter (HOSPITAL_COMMUNITY): Payer: Self-pay | Admitting: Psychiatry

## 2013-04-12 VITALS — BP 112/74 | HR 64 | Ht 72.0 in | Wt 182.6 lb

## 2013-04-12 DIAGNOSIS — F329 Major depressive disorder, single episode, unspecified: Secondary | ICD-10-CM

## 2013-04-12 MED ORDER — TRAZODONE HCL 50 MG PO TABS: 50.0000 mg | ORAL_TABLET | Freq: Every day | ORAL | Status: DC

## 2013-04-12 MED ORDER — LAMOTRIGINE 25 MG PO TABS: ORAL_TABLET | ORAL | Status: DC
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
## 2013-05-03 ENCOUNTER — Ambulatory Visit (HOSPITAL_COMMUNITY): Payer: Self-pay | Admitting: Psychiatry

## 2014-09-08 HISTORY — PX: NO PAST SURGERIES: SHX2092

## 2014-09-27 ENCOUNTER — Other Ambulatory Visit: Payer: Self-pay | Admitting: Medical

## 2014-09-27 ENCOUNTER — Encounter: Payer: Self-pay | Admitting: Medical

## 2014-09-27 ENCOUNTER — Ambulatory Visit (INDEPENDENT_AMBULATORY_CARE_PROVIDER_SITE_OTHER): Payer: 59 | Admitting: Medical

## 2014-09-27 ENCOUNTER — Ambulatory Visit
Admission: RE | Admit: 2014-09-27 | Discharge: 2014-09-27 | Disposition: A | Discharge status: Home / Self Care | Payer: 59 | Source: Ambulatory Visit | Attending: Medical | Admitting: Medical

## 2014-09-27 VITALS — BP 112/78 | HR 64 | Temp 97.8°F | Resp 15 | Wt 196.0 lb

## 2014-09-27 DIAGNOSIS — G8929 Other chronic pain: Secondary | ICD-10-CM

## 2014-09-27 DIAGNOSIS — M79672 Pain in left foot: Principal | ICD-10-CM

## 2014-09-27 DIAGNOSIS — M79673 Pain in unspecified foot: Secondary | ICD-10-CM

## 2014-09-27 DIAGNOSIS — M79662 Pain in left lower leg: Secondary | ICD-10-CM

## 2014-09-27 DIAGNOSIS — R208 Other disturbances of skin sensation: Secondary | ICD-10-CM

## 2014-09-27 DIAGNOSIS — M7989 Other specified soft tissue disorders: Secondary | ICD-10-CM | POA: Diagnosis not present

## 2014-09-27 DIAGNOSIS — M79675 Pain in left toe(s): Secondary | ICD-10-CM

## 2014-09-27 DIAGNOSIS — L409 Psoriasis, unspecified: Secondary | ICD-10-CM | POA: Insufficient documentation

## 2014-09-27 DIAGNOSIS — R200 Anesthesia of skin: Secondary | ICD-10-CM

## 2014-09-27 MED ORDER — MELOXICAM 15 MG PO TABS: 15.0000 mg | ORAL_TABLET | Freq: Every day | ORAL | Status: DC

## 2014-09-27 NOTE — Progress Notes (Signed)
Subjective: Here as a new patient, accompanied by his wife.   He hasn't really had routine health care, preventative care.  Usually not sick.  He is here today for foot pain.  He notes chromic left foot pain for many years, and numbness in left foot over left 2nd and 3rd metatarsals for years as well.  He notes injuring foot in military long ago, had possible fracture once, was casted, but later was told he never had a fracture, so he is not really sure what the situation was.  He is tired of the pain, wants something done about it, but recently in the last week has had worse pain and swelling of left 3rd toe for no reason.  Denies injury, trauma, cut, or other.  No fever, but has felt uneasy.  Denies insect bite.  He does have hx/o psoriasis without hx/o psoriatic arthritis.  Psoriasis flares on elbows, knees, but no prior joint swelling.   No hx/o gout.  No other aggravating or relieving factors. No other complaint.  No Known Allergies  Current Outpatient Prescriptions on File Prior to Visit  Medication Sig Dispense Refill  . hydrOXYzine (ATARAX/VISTARIL) 25 MG tablet Take 1 tablet (25 mg total) by mouth every 4 (four) hours as needed for anxiety. (Patient not taking: Reported on 09/27/2014) 30 tablet 0  . lamoTRIgine (LAMICTAL) 25 MG tablet Take 1 tab daily for 1 week and than 2 tab daily (Patient not taking: Reported on 09/27/2014) 30 tablet 0  . traZODone (DESYREL) 50 MG tablet Take 1 tablet (50 mg total) by mouth at bedtime. (Patient not taking: Reported on 09/27/2014) 14 tablet 0   No current facility-administered medications on file prior to visit.    Past Medical History  Diagnosis Date  . Anxiety   . Depression   . Seasonal allergic rhinitis   . Chronic pain in left foot   . Numbness of left foot     chronic as of 09/2014, left 2nd and 3rd MTP area  . Psoriasis     Past Surgical History  Procedure Laterality Date  . Hernia repair    . No past surgeries  09/2014    Family  History  Problem Relation Age of Onset  . Cancer Mother     bone  . Heart disease Father   . Thyroid disease Sister   . Stroke Maternal Grandmother   . Stroke Maternal Grandfather   . Cancer Paternal Grandfather     bone    History   Social History  . Marital Status: Married    Spouse Name: N/A  . Number of Children: N/A  . Years of Education: N/A   Occupational History  . Not on file.   Social History Main Topics  . Smoking status: Current Some Day Smoker  . Smokeless tobacco: Current User    Types: Snuff  . Alcohol Use: Yes     Comment: occ  . Drug Use: Yes    Special: Marijuana  . Sexual Activity: Not Currently   Other Topics Concern  . Not on file   Social History Narrative   Married, has 3 children, self employed, Solicitor business, exercise some   Reviewed their medical, surgical, family, social, medication, and allergy history and updated chart as appropriate.  ROS as in subjective   Objective: BP 112/78 mmHg  Pulse 64  Temp(Src) 97.8 F (36.6 C) (Oral)  Resp 15  Wt 196 lb (88.905 kg)  Gen: wd, wn, nad Skin:  there is a small 2mm x 5mm whitish appearing area of medial side of distal left 3rd toe and similar smaller whitish/lucent area of lateral side in same areas distal toe, there are somewhat scaly erythematous patches of bilat knees and lesser extent on elbows, no other skin lesions, no warmth, erythema, induration or fluctuance No edema Feet and legs neurovascularly intact, no obvious decreased sensation or strength, normal cap refill, 2+ pedal pulses MSK: tender left 3rd toe in general and mild swelling in general of left 3rd toe, mild pain with resisted flexion and extension of left 3rd toe.  The left second and fifth toes seems to raise up above the other toes somewhat.   No other deformity.  Rest of ankle and foot and toe exam unremarkable, good arches, normal ROM     Assessment: Encounter Diagnoses  Name Primary?  . Chronic foot  pain, left Yes  . Numbness of left foot   . Pain and swelling of toe, left   . Psoriasis     Plan: Discussed his chronic and acute symptoms.   Will send for xray.  Discussed possible differential .  No obvious signs of erythema or warmth to suggest infection.  No hx/o gout, no recent injury.  There is swelling of left 3rd toe only.  One etiology to consider would also be psoriatic arthritis.  F/u pending xrays, return soon for physical

## 2014-09-28 ENCOUNTER — Telehealth: Payer: Self-pay | Admitting: Internal Medicine

## 2014-09-28 NOTE — Telephone Encounter (Signed)
Toniann Fail has faxed over referral info and did referral through insurance

## 2014-09-28 NOTE — Telephone Encounter (Signed)
Pt called stating that he wants to be referred to a foot doctor. Please refer and advise pt

## 2014-09-29 ENCOUNTER — Telehealth: Payer: Self-pay | Admitting: Medical

## 2014-09-29 NOTE — Telephone Encounter (Signed)
Pt called stating that Brent Martin at [facility] office at [facility] on [address] told him that they have not rcvd the referral from our office. I pulled the fax and it was faxed to their office yesterday. Pt was demanding that his referral be taken care of today and that he was not going to make another call to work on this issue. I called Brent Martin who confirmed with me that she did get the referral that we faxed yesterday and that she had just taken it off their fax 10 minutes prior. She said she will call the patient to make an appointment after she gets approval from his insurance. Called pt back and left message  letting him know this info.

## 2016-11-25 IMAGING — CR DG FOOT COMPLETE 3+V*L*
3 series · 3 of 3 positions shown · non-contrast
Comparison: None

CLINICAL DATA: Chronic LEFT foot pain for 20 years, remote fracture
20 years ago, numbness at second and third metatarsal region, LEFT
third toe pain and question soft tissue swelling

EXAM:
LEFT FOOT - COMPLETE 3+ VIEW

[x foot ap left]
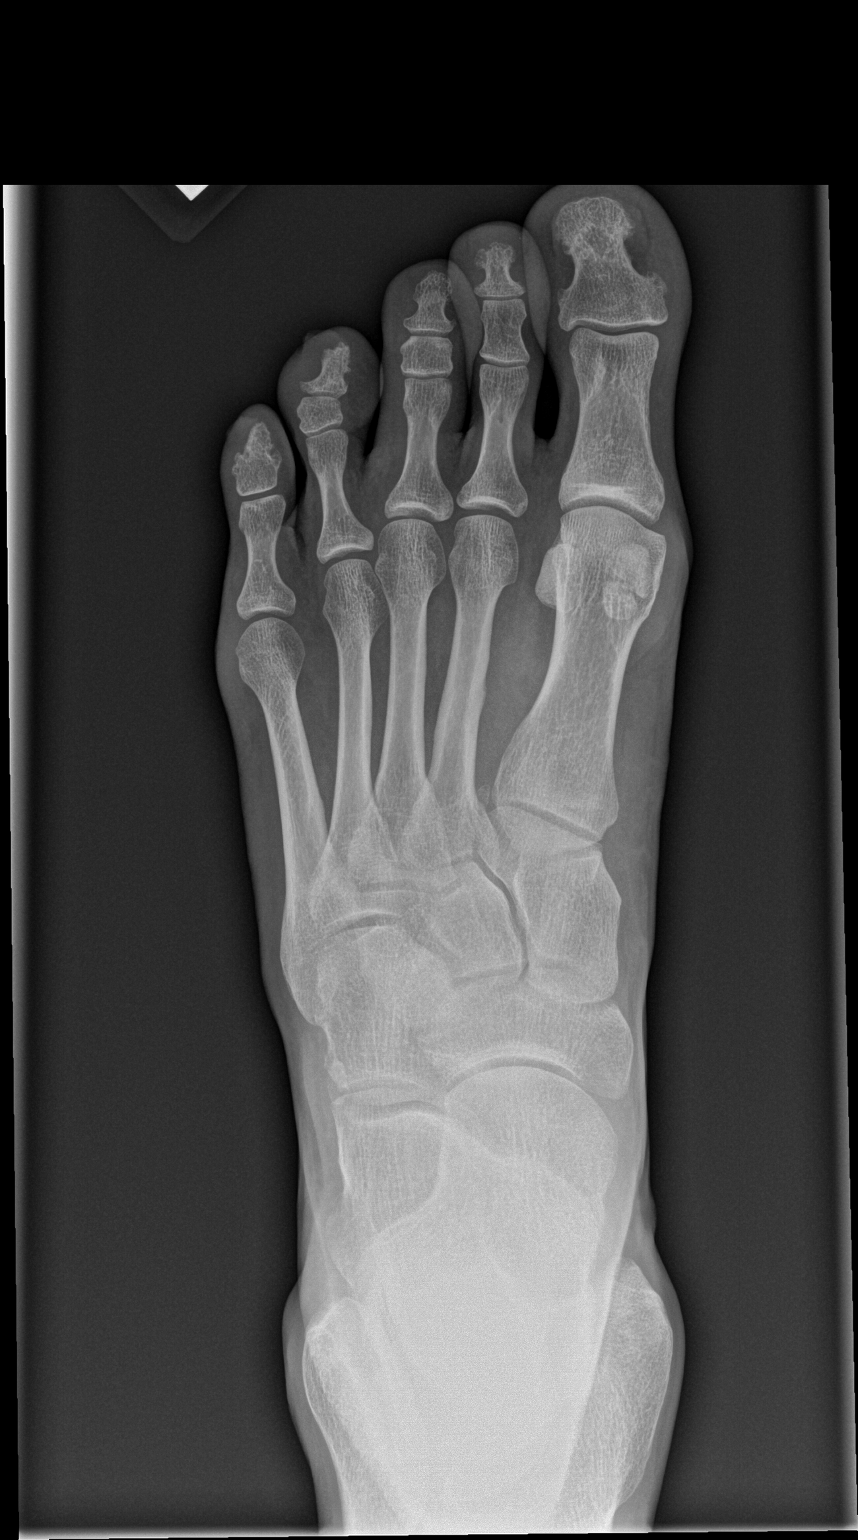

[x foot obl left]
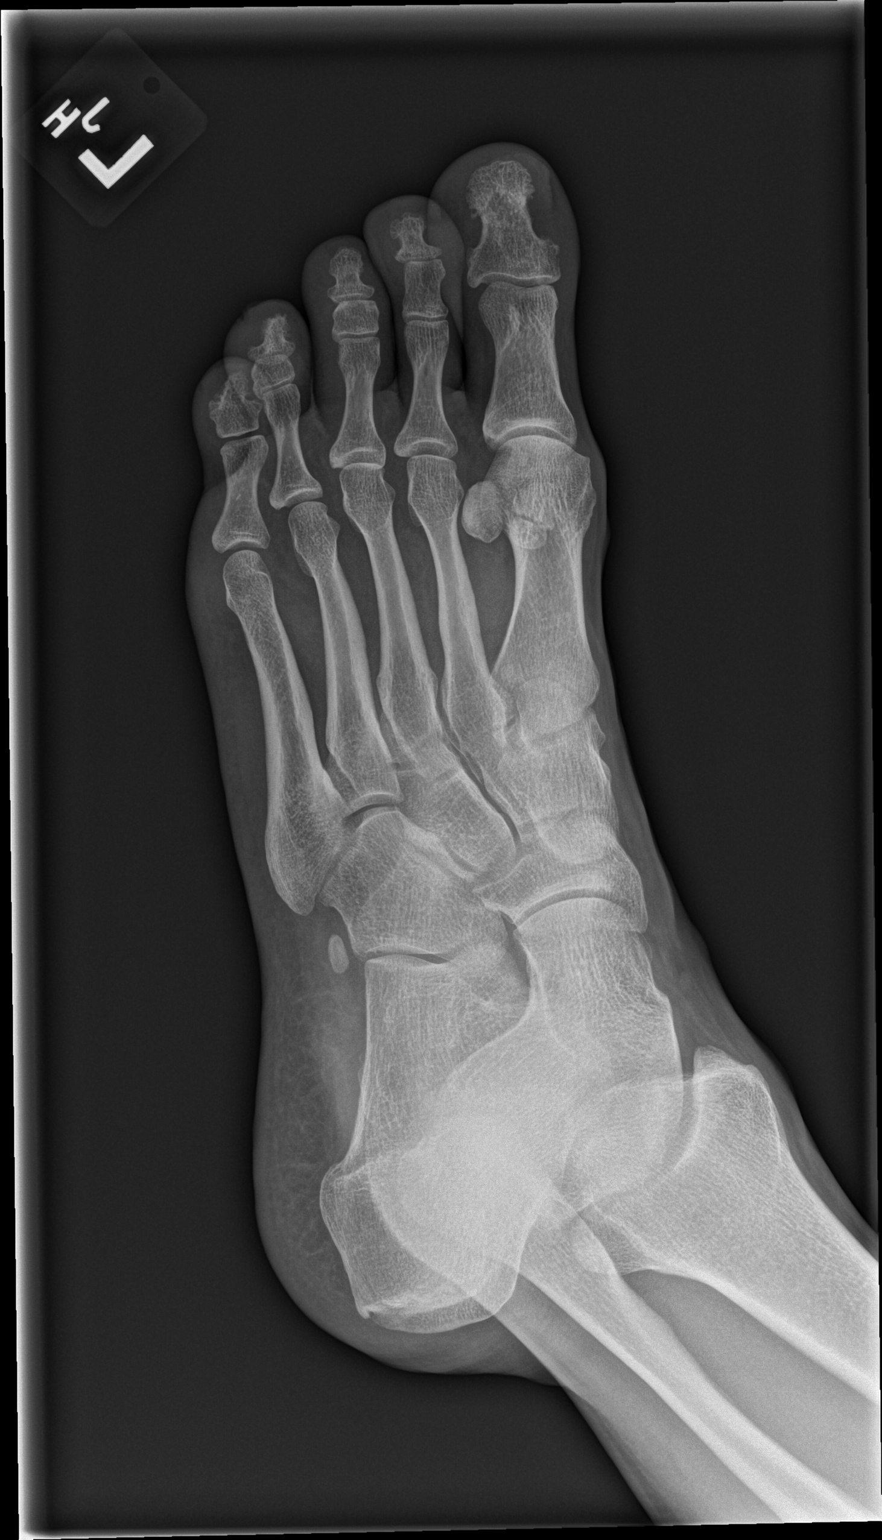

[x foot lat left]
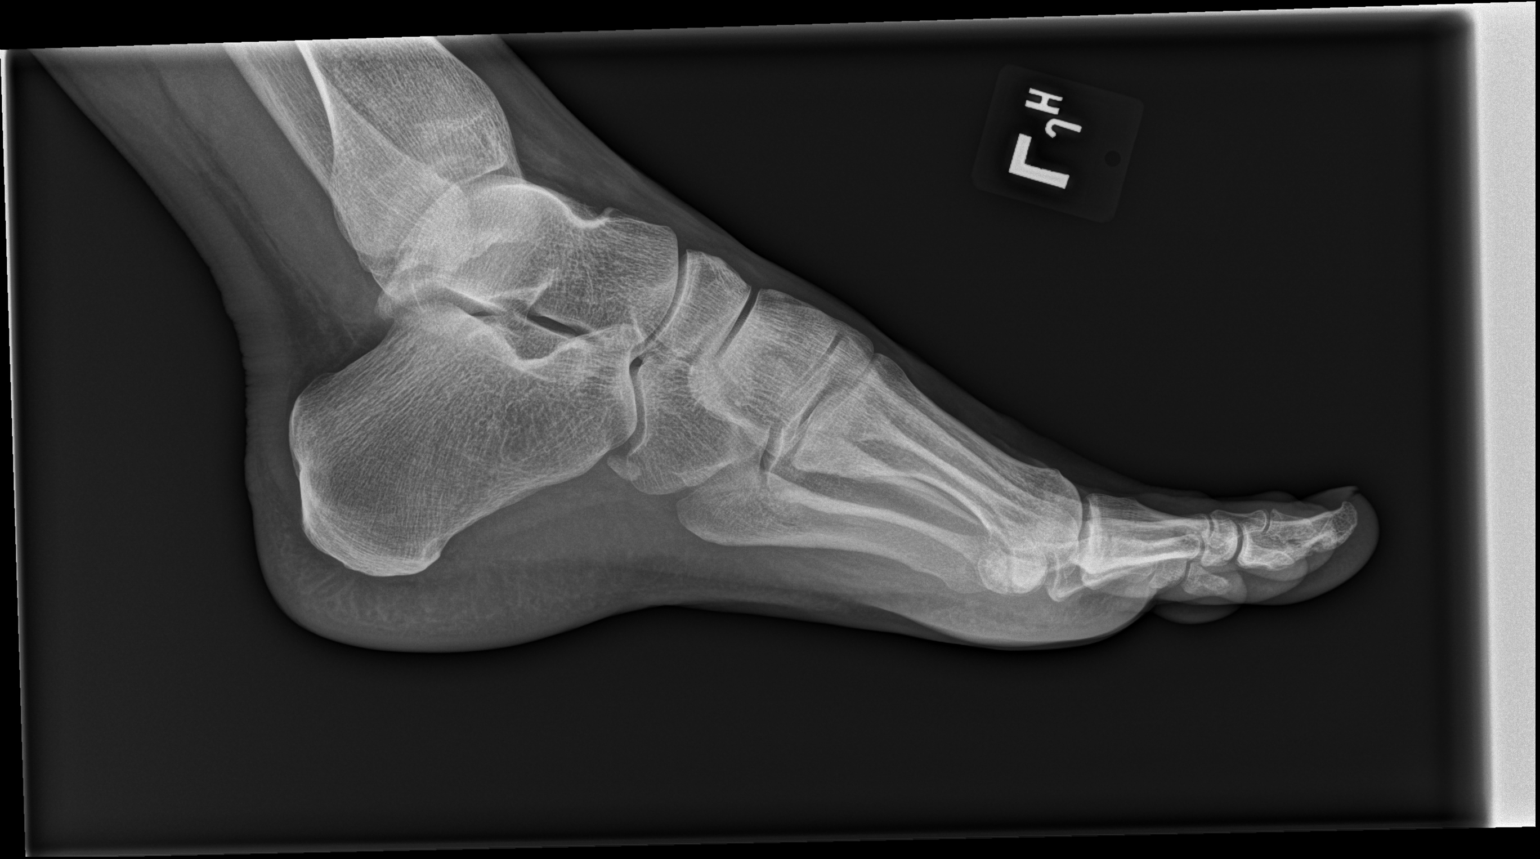

[3 of 3 positions shown; findings below may reference images not displayed]

FINDINGS: Osseous mineralization normal.

Joint spaces preserved.

No acute fracture, dislocation, or bone destruction.

Soft tissues radiographically unremarkable.
IMPRESSION: Normal exam.

## 2024-02-01 ENCOUNTER — Encounter (HOSPITAL_COMMUNITY): Payer: Self-pay | Admitting: *Deleted

## 2024-02-01 ENCOUNTER — Emergency Department (HOSPITAL_COMMUNITY)
Admission: EM | Admit: 2024-02-01 | Discharge: 2024-02-01 | Disposition: A | Discharge status: Home / Self Care | Payer: Self-pay | Source: Home / Self Care | Attending: Emergency Medicine | Admitting: Emergency Medicine

## 2024-02-01 ENCOUNTER — Emergency Department (HOSPITAL_COMMUNITY): Payer: Self-pay | Admitting: Anesthesiology

## 2024-02-01 ENCOUNTER — Emergency Department (HOSPITAL_COMMUNITY): Payer: Self-pay

## 2024-02-01 ENCOUNTER — Other Ambulatory Visit: Payer: Self-pay

## 2024-02-01 ENCOUNTER — Encounter (HOSPITAL_COMMUNITY): Admission: EM | Disposition: A | Discharge status: Home / Self Care | Payer: Self-pay | Source: Home / Self Care

## 2024-02-01 ENCOUNTER — Ambulatory Visit (HOSPITAL_COMMUNITY)
Admission: EM | Admit: 2024-02-01 | Discharge: 2024-02-01 | Disposition: A | Discharge status: Home / Self Care | Payer: Self-pay | Attending: Anesthesiology | Admitting: Anesthesiology

## 2024-02-01 DIAGNOSIS — W540XXA Bitten by dog, initial encounter: Secondary | ICD-10-CM | POA: Insufficient documentation

## 2024-02-01 DIAGNOSIS — Z5329 Procedure and treatment not carried out because of patient's decision for other reasons: Secondary | ICD-10-CM | POA: Insufficient documentation

## 2024-02-01 DIAGNOSIS — Z23 Encounter for immunization: Secondary | ICD-10-CM | POA: Insufficient documentation

## 2024-02-01 DIAGNOSIS — S62522B Displaced fracture of distal phalanx of left thumb, initial encounter for open fracture: Secondary | ICD-10-CM

## 2024-02-01 DIAGNOSIS — S61052A Open bite of left thumb without damage to nail, initial encounter: Secondary | ICD-10-CM | POA: Diagnosis present

## 2024-02-01 HISTORY — DX: Unspecified asthma, uncomplicated: J45.909

## 2024-02-01 HISTORY — PX: AMPUTATION FINGER: SHX6594

## 2024-02-01 SURGERY — AMPUTATION, FINGER
Anesthesia: General | Laterality: Left

## 2024-02-01 MED ORDER — FENTANYL CITRATE (PF) 100 MCG/2ML IJ SOLN
INTRAMUSCULAR | Status: AC
  Filled 2024-02-01: qty 2

## 2024-02-01 MED ORDER — CHLORHEXIDINE GLUCONATE 4 % EX SOLN: 60.0000 mL | Freq: Once | CUTANEOUS | Status: DC

## 2024-02-01 MED ORDER — POVIDONE-IODINE 10 % EX SWAB: 2.0000 | Freq: Once | CUTANEOUS | Status: DC

## 2024-02-01 MED ORDER — SODIUM CHLORIDE 0.9 % IV SOLN: 12.5000 mg | INTRAVENOUS | Status: DC | PRN

## 2024-02-01 MED ORDER — ORAL CARE MOUTH RINSE: 15.0000 mL | Freq: Once | OROMUCOSAL | Status: AC

## 2024-02-01 MED ORDER — AMISULPRIDE (ANTIEMETIC) 5 MG/2ML IV SOLN: 10.0000 mg | Freq: Once | INTRAVENOUS | Status: DC | PRN

## 2024-02-01 MED ORDER — PHENYLEPHRINE 80 MCG/ML (10ML) SYRINGE FOR IV PUSH (FOR BLOOD PRESSURE SUPPORT)
PREFILLED_SYRINGE | INTRAVENOUS | Status: DC | PRN
  Administered 2024-02-01: 160 ug via INTRAVENOUS

## 2024-02-01 MED ORDER — DEXAMETHASONE SOD PHOSPHATE PF 10 MG/ML IJ SOLN
INTRAMUSCULAR | Status: DC | PRN
  Administered 2024-02-01: 4 mg via INTRAVENOUS

## 2024-02-01 MED ORDER — IPRATROPIUM-ALBUTEROL 0.5-2.5 (3) MG/3ML IN SOLN
RESPIRATORY_TRACT | Status: AC
  Filled 2024-02-01: qty 3

## 2024-02-01 MED ORDER — OXYCODONE HCL 10 MG PO TABS: 5.0000 mg | ORAL_TABLET | Freq: Four times a day (QID) | ORAL | 0 refills | Status: AC | PRN

## 2024-02-01 MED ORDER — PHENYLEPHRINE HCL-NACL 20-0.9 MG/250ML-% IV SOLN
INTRAVENOUS | Status: DC | PRN
  Administered 2024-02-01: 30 ug/min via INTRAVENOUS

## 2024-02-01 MED ORDER — LIDOCAINE HCL (PF) 1 % IJ SOLN
INTRAMUSCULAR | Status: AC
  Filled 2024-02-01: qty 30

## 2024-02-01 MED ORDER — 0.9 % SODIUM CHLORIDE (POUR BTL) OPTIME
TOPICAL | Status: DC | PRN
  Administered 2024-02-01: 1000 mL

## 2024-02-01 MED ORDER — OXYCODONE HCL 5 MG/5ML PO SOLN: 5.0000 mg | Freq: Once | ORAL | Status: AC | PRN

## 2024-02-01 MED ORDER — CEPHALEXIN 500 MG PO CAPS: 500.0000 mg | ORAL_CAPSULE | Freq: Four times a day (QID) | ORAL | 0 refills | Status: AC

## 2024-02-01 MED ORDER — PROPOFOL 10 MG/ML IV BOLUS
INTRAVENOUS | Status: AC
  Filled 2024-02-01: qty 20

## 2024-02-01 MED ORDER — OXYCODONE HCL 5 MG PO TABS
ORAL_TABLET | ORAL | Status: AC
  Filled 2024-02-01: qty 1

## 2024-02-01 MED ORDER — CEFAZOLIN SODIUM-DEXTROSE 2-4 GM/100ML-% IV SOLN
2.0000 g | INTRAVENOUS | Status: AC
  Administered 2024-02-01: 2 g via INTRAVENOUS
  Filled 2024-02-01: qty 100

## 2024-02-01 MED ORDER — CEFAZOLIN SODIUM-DEXTROSE 2-4 GM/100ML-% IV SOLN: 2.0000 g | Freq: Once | INTRAVENOUS | Status: DC

## 2024-02-01 MED ORDER — CHLORHEXIDINE GLUCONATE 0.12 % MT SOLN
15.0000 mL | Freq: Once | OROMUCOSAL | Status: AC
Start: 2024-02-01 — End: 2024-02-01
  Administered 2024-02-01: 15 mL via OROMUCOSAL
  Filled 2024-02-01: qty 15

## 2024-02-01 MED ORDER — PROPOFOL 10 MG/ML IV BOLUS
INTRAVENOUS | Status: DC | PRN
  Administered 2024-02-01: 100 mg via INTRAVENOUS

## 2024-02-01 MED ORDER — CELECOXIB 200 MG PO CAPS
200.0000 mg | ORAL_CAPSULE | Freq: Once | ORAL | Status: AC
  Administered 2024-02-01: 200 mg via ORAL
  Filled 2024-02-01: qty 1

## 2024-02-01 MED ORDER — DEXMEDETOMIDINE HCL IN NACL 80 MCG/20ML IV SOLN
INTRAVENOUS | Status: DC | PRN
  Administered 2024-02-01: 12 ug via INTRAVENOUS

## 2024-02-01 MED ORDER — BUPIVACAINE HCL (PF) 0.25 % IJ SOLN
INTRAMUSCULAR | Status: AC
Start: 2024-02-01 — End: 2024-02-01
  Filled 2024-02-01: qty 30

## 2024-02-01 MED ORDER — FENTANYL CITRATE (PF) 100 MCG/2ML IJ SOLN
INTRAMUSCULAR | Status: AC
  Administered 2024-02-01: 50 ug via INTRAVENOUS
  Filled 2024-02-01: qty 2

## 2024-02-01 MED ORDER — OXYCODONE HCL 5 MG PO TABS
5.0000 mg | ORAL_TABLET | Freq: Once | ORAL | Status: AC | PRN
  Administered 2024-02-01: 5 mg via ORAL

## 2024-02-01 MED ORDER — LACTATED RINGERS IV SOLN: INTRAVENOUS | Status: DC

## 2024-02-01 MED ORDER — AMOXICILLIN-POT CLAVULANATE 875-125 MG PO TABS
1.0000 | ORAL_TABLET | Freq: Once | ORAL | Status: AC
  Administered 2024-02-01: 1 via ORAL
  Filled 2024-02-01: qty 1

## 2024-02-01 MED ORDER — BUPIVACAINE HCL (PF) 0.25 % IJ SOLN
10.0000 mL | Freq: Once | INTRAMUSCULAR | Status: DC
  Filled 2024-02-01: qty 30

## 2024-02-01 MED ORDER — FENTANYL CITRATE (PF) 250 MCG/5ML IJ SOLN
INTRAMUSCULAR | Status: DC | PRN
  Administered 2024-02-01: 100 ug via INTRAVENOUS

## 2024-02-01 MED ORDER — CEFAZOLIN SODIUM-DEXTROSE 2-4 GM/100ML-% IV SOLN: 2.0000 g | INTRAVENOUS | Status: DC

## 2024-02-01 MED ORDER — ONDANSETRON HCL 4 MG/2ML IJ SOLN
INTRAMUSCULAR | Status: DC | PRN
  Administered 2024-02-01: 4 mg via INTRAVENOUS

## 2024-02-01 MED ORDER — HYDROCODONE-ACETAMINOPHEN 5-325 MG PO TABS
2.0000 | ORAL_TABLET | Freq: Once | ORAL | Status: AC
  Administered 2024-02-01: 2 via ORAL
  Filled 2024-02-01: qty 2

## 2024-02-01 MED ORDER — TETANUS-DIPHTH-ACELL PERTUSSIS 5-2-15.5 LF-MCG/0.5 IM SUSP
0.5000 mL | Freq: Once | INTRAMUSCULAR | Status: AC
  Administered 2024-02-01: 0.5 mL via INTRAMUSCULAR
  Filled 2024-02-01: qty 0.5

## 2024-02-01 MED ORDER — FENTANYL CITRATE (PF) 100 MCG/2ML IJ SOLN: 50.0000 ug | Freq: Once | INTRAMUSCULAR | Status: AC

## 2024-02-01 MED ORDER — IPRATROPIUM-ALBUTEROL 0.5-2.5 (3) MG/3ML IN SOLN
3.0000 mL | Freq: Once | RESPIRATORY_TRACT | Status: AC
  Administered 2024-02-01: 3 mL via RESPIRATORY_TRACT

## 2024-02-01 MED ORDER — BUPIVACAINE HCL 0.25 % IJ SOLN
10.0000 mL | Freq: Once | INTRAMUSCULAR | Status: DC
  Filled 2024-02-01: qty 10

## 2024-02-01 MED ORDER — FENTANYL CITRATE (PF) 100 MCG/2ML IJ SOLN
25.0000 ug | INTRAMUSCULAR | Status: DC | PRN
  Administered 2024-02-01 (×3): 50 ug via INTRAVENOUS

## 2024-02-01 SURGICAL SUPPLY — 41 items
APPLICATOR CHLORAPREP 3ML ORNG (MISCELLANEOUS) ×1 IMPLANT
BLADE ARTHRO LOK 4 BEAVER (BLADE) IMPLANT
BLADE SURG 15 STRL LF DISP TIS (BLADE) ×2 IMPLANT
BNDG COHESIVE 4X5 TAN STRL LF (GAUZE/BANDAGES/DRESSINGS) ×1 IMPLANT
BNDG COMPR ESMARK 4X3 LF (GAUZE/BANDAGES/DRESSINGS) ×1 IMPLANT
BNDG ELASTIC 3INX 5YD STR LF (GAUZE/BANDAGES/DRESSINGS) IMPLANT
BNDG ELASTIC 4INX 5YD STR LF (GAUZE/BANDAGES/DRESSINGS) ×1 IMPLANT
BNDG GAUZE DERMACEA FLUFF 4 (GAUZE/BANDAGES/DRESSINGS) ×1 IMPLANT
CHLORAPREP W/TINT 26 (MISCELLANEOUS) ×1 IMPLANT
CORD BIPOLAR FORCEPS 12FT (ELECTRODE) ×1 IMPLANT
COVER BACK TABLE 60X90IN (DRAPES) ×1 IMPLANT
CUFF TOURN SGL QUICK 18X4 (TOURNIQUET CUFF) IMPLANT
DRAPE OEC MINIVIEW 54X84 (DRAPES) ×1 IMPLANT
DRAPE SURG 17X23 STRL (DRAPES) ×1 IMPLANT
DRSG TELFA 3X8 NADH STRL (GAUZE/BANDAGES/DRESSINGS) IMPLANT
DRSG XEROFORM 1X8 (GAUZE/BANDAGES/DRESSINGS) IMPLANT
GAUZE SPONGE 4X4 12PLY STRL (GAUZE/BANDAGES/DRESSINGS) ×1 IMPLANT
GAUZE STRETCH 2X75IN STRL (MISCELLANEOUS) ×1 IMPLANT
GAUZE XEROFORM 1X8 LF (GAUZE/BANDAGES/DRESSINGS) ×1 IMPLANT
GLOVE BIO SURGEON STRL SZ7.5 (GLOVE) ×2 IMPLANT
GLOVE BIOGEL PI IND STRL 7.5 (GLOVE) ×2 IMPLANT
GOWN STRL REUS W/ TWL LRG LVL3 (GOWN DISPOSABLE) ×1 IMPLANT
GOWN STRL REUS W/ TWL XL LVL3 (GOWN DISPOSABLE) ×1 IMPLANT
GOWN STRL SURGICAL XL XLNG (GOWN DISPOSABLE) ×1 IMPLANT
KIT BASIN OR (CUSTOM PROCEDURE TRAY) ×1 IMPLANT
KWIRE DBL TROCAR .062X4 (WIRE) IMPLANT
NDL HYPO 25X5/8 SAFETYGLIDE (NEEDLE) IMPLANT
PACK ORTHO EXTREMITY (CUSTOM PROCEDURE TRAY) ×1 IMPLANT
PADDING CAST ABS COTTON 3X4 (CAST SUPPLIES) IMPLANT
SHEET MEDIUM DRAPE 40X70 STRL (DRAPES) ×1 IMPLANT
SOLN 0.9% NACL POUR BTL 1000ML (IV SOLUTION) IMPLANT
SPIKE FLUID TRANSFER (MISCELLANEOUS) IMPLANT
SPLINT PLASTER CAST FAST 4X15 (CAST SUPPLIES) IMPLANT
STOCKINETTE IMPERVIOUS 9X36 MD (GAUZE/BANDAGES/DRESSINGS) IMPLANT
SUCTION TUBE FRAZIER 10FR DISP (SUCTIONS) IMPLANT
SUT ETHILON 4 0 PS 2 18 (SUTURE) IMPLANT
SUT MNCRL AB 4-0 PS2 18 (SUTURE) ×1 IMPLANT
SYR BULB EAR ULCER 3OZ GRN STR (SYRINGE) ×2 IMPLANT
SYR CONTROL 10ML LL (SYRINGE) IMPLANT
TOWEL GREEN STERILE FF (TOWEL DISPOSABLE) ×2 IMPLANT
TUBE CONNECTING 20X1/4 (TUBING) IMPLANT

## 2024-02-01 NOTE — ED Provider Notes (Signed)
 Brent Martin Provider Note   CSN: [id number] Arrival date & time: 02/01/24  1033     Patient presents with: Extremity Laceration   Brent Martin is a 53 y.o. male.  53 year old male presents ED with complaints of dog bite that occurred approximately 2 hours ago.  He reports his dog is up-to-date on his shots and is approximately 55 years old.  He reports he was mishandling him and he is temperamental which resulted in him getting bit on his left thumb.  Patient reports it was not the dog's fault and he does not want anything to happen to his dog.  Patient reports he does not remember when his last Tetanus shot was. Patient refused vitals in triage.  After further questioning patient got irritated and was questioning what we were going to do to fix his thumb.  It was advised to the patient that we would get an x-ray to further evaluate the bony structures and try to manage pain.  Patient reports excruciating pain in his left thumb. He is right hand dominant.      Prior to Admission medications   Medication Sig Start Date End Date Taking? Authorizing Provider  acetaminophen  (TYLENOL ) 500 MG tablet Take 1,000 mg by mouth daily as needed for mild pain (pain score 1-3) or moderate pain (pain score 4-6).   Yes [provider]    Allergies: Patient has no known allergies.    Review of Systems  Skin:  Positive for wound.  All other systems reviewed and are negative.   Updated Vital Signs BP 122/76   Pulse 74   Temp 98 F (36.7 C)   Resp 16   Ht 6' 1 (1.854 m)   Wt 77.1 kg   SpO2 99%   BMI 22.43 kg/m   Physical Exam Vitals and nursing note reviewed.  Constitutional:      Appearance: Normal appearance.  HENT:     Head: Normocephalic and atraumatic.     Nose: Nose normal.  Eyes:     Extraocular Movements: Extraocular movements intact.     Conjunctiva/sclera: Conjunctivae normal.     Pupils: Pupils are equal, round, and  reactive to light.  Cardiovascular:     Rate and Rhythm: Normal rate.  Pulmonary:     Effort: Pulmonary effort is normal. No respiratory distress.     Breath sounds: Normal breath sounds.  Musculoskeletal:     Cervical back: Normal range of motion.     Comments: Patient has obvious open fracture at left distal thumb.  Patient reports having sensation in the thumb and there is good cap refill.  It crosses the joint space and involves the nail bed patient does not have any range of motion of the thumb due to injury.  Skin:    General: Skin is warm.     Capillary Refill: Capillary refill takes less than 2 seconds.  Neurological:     General: No focal deficit present.     Mental Status: He is alert.  Psychiatric:        Mood and Affect: Mood normal.        Behavior: Behavior normal.     (all labs ordered are listed, but only abnormal results are displayed) Labs Reviewed  SURGICAL PCR SCREEN    EKG: None  Radiology: DG Finger Thumb Left Result Date: 02/01/2024 CLINICAL DATA:  Dog bite to the thumb EXAM: LEFT THUMB 3V COMPARISON:  None Available. FINDINGS: Soft tissue  injury of the dorsal thumb with associated underlying comminuted fracture of the thumb distal phalangeal base with full shaft width palmar displacement of the dominant fracture fragment. IMPRESSION: Displaced, open, comminuted fracture of the thumb distal phalanx. Electronically Signed   By: Limin  Xu M.D.   On: 02/01/2024 12:22     Procedures   Medications Ordered in the ED  HYDROcodone -acetaminophen  (NORCO/VICODIN) 5-325 MG per tablet 2 tablet (2 tablets Oral Given 02/01/24 1142)  Tdap (ADACEL ) injection 0.5 mL (0.5 mLs Intramuscular Given 02/01/24 1143)  amoxicillin -clavulanate (AUGMENTIN ) 875-125 MG per tablet 1 tablet (1 tablet Oral Given 02/01/24 1142)     53 y.o. male presents to the ED with complaints of dog bite, The differential diagnosis includes but is not limited to fracture, arterial/venous injury,  nerve injury, dislocation (Ddx)  On arrival pt is nontoxic and refused vitals. Exam significant for obvious open injury on the left thumb.  I ordered medication Tdap, Augmentin , hydrocodone  for pain and infection prophylaxis.  Imaging Studies ordered:  I ordered imaging studies which included complete left thumb x-ray.  X-ray resulted and Displaced, open, comminuted fracture of the thumb distal phalanx   ED Course:   Patient has obvious injury as described above but is sitting comfortably in ED bed.  Patient was given hydrocodone  Tdap and Augmentin  prior to x-ray.    Ozell Purchase, PA-C with Ortho hand surgery was consulted for consult.  He advised to do digital block with bupivacaine  to evaluate the injury further and he would consult in ED.  Digital block with bupivacaine  was done in ED with success.  Upon further assessment Ozell Purchase came into the ED room for evaluation.  Per his recommendation he advised that ortho would amputate the thumb and he advised patient to be n.p.o. until surgery.  At that time nurse advised patient eloped.  They attempted to locate the patient and could not find him.   Portions of this note were generated with Scientist, clinical (histocompatibility and immunogenetics). Dictation errors may occur despite best attempts at proofreading.   Final diagnoses:  Open displaced fracture of distal phalanx of left thumb, initial encounter    ED Discharge Orders     None          Brent Martin, [place] 02/01/24 1504    Brent Jayson LABOR, DO 02/04/24 [phone]

## 2024-02-01 NOTE — Progress Notes (Signed)
 Pt c/o increasing pain. Dr. Lucious notified, orders provided.   PIV started, no labs needed per Dr. Lucious.

## 2024-02-01 NOTE — ED Notes (Signed)
 Attempted to take vitals on pt, but pt screamed Is there anyone white who could take care of me? Pt continued to yell at staff while being rude refusing care. RN, Interior And Spatial Designer, and security made aware.

## 2024-02-01 NOTE — ED Notes (Signed)
 Patient refused to have VS taken , ice pack given to place on his hand. Patient has  ask for [facility] and [facility] called to bedside

## 2024-02-01 NOTE — Consult Note (Signed)
 Reason for Consult:Left thumb amputation Referring Physician: Jayson Pereyra Time called: 1201 Time at bedside: 1250   Brent Martin is an 53 y.o. male.  HPI: Orvis was bitten by his dog earlier this morning. He was brought to the ED and hand surgery was called. X-rays also showed an open distal phalanx fx. He is RHD and does not work.  Past Medical History:  Diagnosis Date   Anxiety    Chronic pain in left foot    Depression    Numbness of left foot    chronic as of 09/2014, left 2nd and 3rd MTP area   Psoriasis    Seasonal allergic rhinitis     Past Surgical History:  Procedure Laterality Date   HERNIA REPAIR     NO PAST SURGERIES  09/2014    Family History  Problem Relation Age of Onset   Cancer Mother        bone   Heart disease Father    Thyroid disease Sister    Stroke Maternal Grandmother    Stroke Maternal Grandfather    Cancer Paternal Grandfather        bone    Social History:  reports that he has been smoking. His smokeless tobacco use includes snuff. He reports current alcohol use. He reports current drug use. Drug: Marijuana.  Allergies: No Known Allergies  Medications: I have reviewed the patient's current medications.  No results found for this or any previous visit (from the past 48 hours).  DG Finger Thumb Left Result Date: 02/01/2024 CLINICAL DATA:  Dog bite to the thumb EXAM: LEFT THUMB 3V COMPARISON:  None Available. FINDINGS: Soft tissue injury of the dorsal thumb with associated underlying comminuted fracture of the thumb distal phalangeal base with full shaft width palmar displacement of the dominant fracture fragment. IMPRESSION: Displaced, open, comminuted fracture of the thumb distal phalanx. Electronically Signed   By: Limin  Xu M.D.   On: 02/01/2024 12:22    Review of Systems  HENT:  Negative for ear discharge, ear pain, hearing loss and tinnitus.   Eyes:  Negative for photophobia and pain.  Respiratory:  Negative for cough and  shortness of breath.   Cardiovascular:  Negative for chest pain.  Gastrointestinal:  Negative for abdominal pain, nausea and vomiting.  Genitourinary:  Negative for dysuria, flank pain, frequency and urgency.  Musculoskeletal:  Positive for arthralgias (Left thumb). Negative for back pain, myalgias and neck pain.  Neurological:  Negative for dizziness and headaches.  Hematological:  Does not bruise/bleed easily.  Psychiatric/Behavioral:  The patient is not nervous/anxious.    Blood pressure 122/76, pulse 74, temperature 98 F (36.7 C), resp. rate 16, height 6' 1 (1.854 m), weight 77.1 kg, SpO2 99%. Physical Exam Constitutional:      General: He is not in acute distress.    Appearance: He is well-developed. He is not diaphoretic.  HENT:     Head: Normocephalic and atraumatic.  Eyes:     General: No scleral icterus.       Right eye: No discharge.        Left eye: No discharge.     Conjunctiva/sclera: Conjunctivae normal.  Cardiovascular:     Rate and Rhythm: Normal rate and regular rhythm.  Pulmonary:     Effort: Pulmonary effort is normal. No respiratory distress.  Musculoskeletal:     Cervical back: Normal range of motion.     Comments: Left shoulder, elbow, wrist, digits- Partial amputation thumb P3, cap refill <2s distally,  intact rad/uln sensation distally, mod TTP, no instability, no blocks to motion  Sens  Ax/R/M/U intact  Mot   Ax/ R/ PIN/ M/ AIN/ U intact  Rad 2+  Skin:    General: Skin is warm and dry.  Neurological:     Mental Status: He is alert.  Psychiatric:        Mood and Affect: Mood normal.        Behavior: Behavior normal.     Assessment/Plan: Left thumb amputation -- Plan revision amputation with likely CRPP today with Dr. Arlinda. Please keep NPO. Anticipate discharge after surgery.    Ozell DOROTHA Ned, PA-C Orthopedic Surgery [phone] 02/01/2024, 1:14 PM

## 2024-02-01 NOTE — ED Notes (Signed)
 Attempted to provide pt with

## 2024-02-01 NOTE — Anesthesia Procedure Notes (Signed)
 Procedure Name: LMA Insertion Date/Time: 02/01/2024 4:41 PM  Performed by: Lockie Flesher, CRNAPre-anesthesia Checklist: Patient identified, Emergency Drugs available, Suction available and Patient being monitored Patient Re-evaluated:Patient Re-evaluated prior to induction Oxygen Delivery Method: Circle System Utilized Preoxygenation: Pre-oxygenation with 100% oxygen Induction Type: IV induction Ventilation: Mask ventilation without difficulty LMA: LMA inserted LMA Size: 4.0 Number of attempts: 1 Airway Equipment and Method: Bite block Placement Confirmation: positive ETCO2 Tube secured with: Tape Dental Injury: Teeth and Oropharynx as per pre-operative assessment

## 2024-02-01 NOTE — Discharge Instructions (Signed)

## 2024-02-01 NOTE — Transfer of Care (Signed)
 Immediate Anesthesia Transfer of Care Note  Patient: Brent Martin  Procedure(s) Performed: AMPUTATION, FINGER (Left)  Patient Location: PACU  Anesthesia Type:General  Level of Consciousness: awake, alert , and oriented  Airway & Oxygen Therapy: Patient Spontanous Breathing  Post-op Assessment: Report given to RN and Post -op Vital signs reviewed and stable  Post vital signs: Reviewed and stable  Last Vitals:  Vitals Value Taken Time  BP 108/77 02/01/24 17:30  Temp    Pulse 71 02/01/24 17:32  Resp 19 02/01/24 17:32  SpO2 97 % 02/01/24 17:32  Vitals shown include unfiled device data.  Last Pain:  Vitals:   02/01/24 1600  TempSrc:   PainSc: Asleep      Patients Stated Pain Goal: 3 (02/01/24 1524)  Complications: No notable events documented.

## 2024-02-01 NOTE — ED Notes (Signed)
 Nov.24 I walk into pt room and pt asked for ice because the ice pack is melting I said okay give me one second to get your vitals and ill grad that pt started talking rudely to me and staff Pt refused to let me take his vitals and demanded I give him ice I went out the room and grad and look for some ice pt wife then followed me out the room and stared yelling at me, pt then processed to say  can we get somebody white in here

## 2024-02-01 NOTE — Anesthesia Preprocedure Evaluation (Addendum)
 Anesthesia Evaluation  Patient identified by MRN, date of birth, ID band Patient awake    Reviewed: Allergy & Precautions, NPO status , Patient's Chart, lab work & pertinent test results  History of Anesthesia Complications Negative for: history of anesthetic complications  Airway Mallampati: II  TM Distance: >3 FB Neck ROM: Full    Dental  (+) Dental Advisory Given, Chipped   Pulmonary asthma , Current SmokerPatient did not abstain from smoking.   Pulmonary exam normal        Cardiovascular negative cardio ROS Normal cardiovascular exam     Neuro/Psych  PSYCHIATRIC DISORDERS Anxiety Depression    negative neurological ROS     GI/Hepatic negative GI ROS,,,(+)     substance abuse  marijuana use  Endo/Other  negative endocrine ROS    Renal/GU negative Renal ROS     Musculoskeletal negative musculoskeletal ROS (+)    Abdominal   Peds  Hematology negative hematology ROS (+)   Anesthesia Other Findings Chronic left foot pain   Reproductive/Obstetrics                              Anesthesia Physical Anesthesia Plan  ASA: 2  Anesthesia Plan: General   Post-op Pain Management: Tylenol  PO (pre-op)*, Celebrex  PO (pre-op)* and Precedex    Induction: Intravenous  PONV Risk Score and Plan: 1 and Treatment may vary due to age or medical condition, Ondansetron , Dexamethasone  and Midazolam  Airway Management Planned: LMA  Additional Equipment: None  Intra-op Plan:   Post-operative Plan: Extubation in OR  Informed Consent: I have reviewed the patients History and Physical, chart, labs and discussed the procedure including the risks, benefits and alternatives for the proposed anesthesia with the patient or authorized representative who has indicated his/her understanding and acceptance.     Dental advisory given  Plan Discussed with: CRNA and Anesthesiologist  Anesthesia Plan  Comments:          Anesthesia Quick Evaluation

## 2024-02-01 NOTE — ED Triage Notes (Signed)
 Patient presents to ed c/o dog bite to left thumb  states his dog;s shots are up to date bleeding is controlled, patient is yelling at the time of trying to take care of him. Patient is very verbally abusive to staff and will not answer questions he is delaying his care.

## 2024-02-01 NOTE — Progress Notes (Signed)
 Trying to get report for this patient. Per ED RN, patient and his wife they are in the lobby, trying to decide if the patient want to have this surgery or not. OR notified.

## 2024-02-01 NOTE — ED Triage Notes (Signed)
 Pt was in trauma C but left room and went outside. Pt back and checking back in.

## 2024-02-01 NOTE — ED Triage Notes (Signed)
 Patient left ED after initial work-up for 30-40 minutes. Patient returned and started a new encounter. Report given to short stay and OR in regards that patient still wants surgery on hand from dog bite. Patient advised that PIV and labs will be required and patient in agreement.

## 2024-02-01 NOTE — ED Notes (Signed)
 Went to round on patient and pt significant other told this RN that the pt went outside. Pt significant other informed it is against police. She is attempting to locate pt at this time.

## 2024-02-01 NOTE — Consult Note (Signed)
 HAND SURGERY CONSULTATION  REQUESTING PHYSICIAN: No att. providers found   Chief Complaint: Left thumb dog bite with partial amputation.  HPI: Brent Martin is a 53 y.o. male who presents with left thumb dog bite with partial amputation.  This occurred earlier today, was seen in the emergency department setting and found to have an open distal phalanx fracture.  He is right-hand dominant, not currently working.  Hand dominance: Right Occupation: none  Past Medical History:  Diagnosis Date   Anxiety    Asthma    Chronic pain in left foot    Depression    Numbness of left foot    chronic as of 09/2014, left 2nd and 3rd MTP area   Psoriasis    Seasonal allergic rhinitis    Past Surgical History:  Procedure Laterality Date   HERNIA REPAIR     NO PAST SURGERIES  09/2014   Social History   Socioeconomic History   Marital status: Married    Spouse name: Not on file   Number of children: Not on file   Years of education: Not on file   Highest education level: Not on file  Occupational History   Not on file  Tobacco Use   Smoking status: Some Days   Smokeless tobacco: Current    Types: Snuff  Vaping Use   Vaping status: Never Used  Substance and Sexual Activity   Alcohol use: Yes    Comment: occ   Drug use: Yes    Types: Marijuana    Comment: As of 02/01/24: last use 02/01/24   Sexual activity: Not Currently  Other Topics Concern   Not on file  Social History Narrative   Married, has 3 children, self employed, solicitor business, exercise some   Social Drivers of Corporate Investment Banker Strain: Not on file  Food Insecurity: Not on file  Transportation Needs: Not on file  Physical Activity: Not on file  Stress: Not on file  Social Connections: Not on file   Family History  Problem Relation Age of Onset   Cancer Mother        bone   Heart disease Father    Thyroid disease Sister    Stroke Maternal Grandmother    Stroke Maternal Grandfather     Cancer Paternal Grandfather        bone   - negative except otherwise stated in the family history section No Known Allergies Prior to Admission medications   Medication Sig Start Date End Date Taking? Authorizing Provider  acetaminophen  (TYLENOL ) 500 MG tablet Take 1,000 mg by mouth daily as needed for mild pain (pain score 1-3) or moderate pain (pain score 4-6).    [provider]   DG Finger Thumb Left Result Date: 02/01/2024 CLINICAL DATA:  Dog bite to the thumb EXAM: LEFT THUMB 3V COMPARISON:  None Available. FINDINGS: Soft tissue injury of the dorsal thumb with associated underlying comminuted fracture of the thumb distal phalangeal base with full shaft width palmar displacement of the dominant fracture fragment. IMPRESSION: Displaced, open, comminuted fracture of the thumb distal phalanx. Electronically Signed   By: Limin  Xu M.D.   On: 02/01/2024 12:22   - Positive ROS: All other systems have been reviewed and were otherwise negative with the exception of those mentioned in the HPI and as above.  Physical Exam: General: No acute distress, resting comfortably Cardiovascular: BUE warm and well perfused, normal rate Respiratory: Normal WOB on RA Skin: Warm  and dry Neurologic: Sensation intact distally Psychiatric: Patient is at baseline mood and affect  Left upper Extremity  Partially potation of the thumb at the distal phalanx level, capillary refill is intact, intact sensation distally to light touch    Assessment: 53 year old male with left thumb partial amputation after dog bite with associated open distal phalanx fracture  Plan: Plan for operating room for left thumb irrigation and debridement, revision amputation versus potential distal phalanx stabilization and pinning  We discussed that we will try to save the partial amputated digit as best we can surgically.  This will be stabilized with a K wire if possible.  I did explain that we may need to perform  revision amputation if the tissue is overly macerated or contaminated based on the surgical exploration.  Risk and benefits of the operation were discussed in detail, risks include but not limited to infection, bleeding, scarring, stiffness, nerve injury, vascular, tendon injury, malunion, nonunion, hardware failure and need for subsequent operation.  Patient consented understanding above.  Thank you for the consult and the opportunity to see Brent Martin  He will likely be discharged postoperatively and will follow-up in 1 week.  Jamin Humphries Afton Alderton, M.D. [facility] 4:15 PM

## 2024-02-01 NOTE — Op Note (Addendum)
 NAME: RODD HEFT MEDICAL RECORD NO: [id number] DATE OF BIRTH: [birth year] FACILITY: [facility] LOCATION: [facility] OR PHYSICIAN: GILDARDO ALDERTON, MD   OPERATIVE REPORT   DATE OF PROCEDURE: 02/01/24    PREOPERATIVE DIAGNOSIS: Left thumb dog bite injury with open distal phalanx fracture   POSTOPERATIVE DIAGNOSIS: Left thumb dog bite injury with open distal phalanx fracture   PROCEDURE: Left thumb irrigation and debridement Left thumb open reduction internal fixation with pinning distal phalanx fracture   SURGEON:  Gildardo Alderton, M.D.   ASSISTANT: Joesph Dinsmore, OPA   ANESTHESIA:  General   INTRAVENOUS FLUIDS:  Per anesthesia flow sheet.   ESTIMATED BLOOD LOSS:  Minimal.   COMPLICATIONS:  None.   SPECIMENS:  none   TOURNIQUET TIME: Not utilized    DISPOSITION:  Stable to PACU.   INDICATIONS: 53 year old male who sustained a left thumb dog bite injury earlier today with an associated open distal phalanx fracture.  Was seen in the emergency department setting, underwent clinical and radiographic workup.  Patient was indicated for left thumb irrigation and debridement with stabilization of the distal phalanx fracture.  We discussed the potential need for revision amputation should the tissue not be deemed viable.  Risks and benefits of surgery were discussed including the risks of infection, bleeding, scarring, stiffness, nerve injury, vascular injury, tendon injury, need for subsequent operation, hardware failure, nonunion, malunion.  He voiced understanding of these risks and elected to proceed.  OPERATIVE COURSE: Patient was seen and identified in the preoperative area and marked appropriately.  Surgical consent had been signed. Preoperative IV antibiotic prophylaxis was given. He was transferred to the operating room and placed in supine position with the Left upper extremity on an arm board.  General anesthesia was induced by the anesthesiologist.  Left upper extremity was prepped  and draped in normal sterile orthopedic fashion.  A surgical pause was performed between the surgeons, anesthesia, and operating room staff and all were in agreement as to the patient, procedure, and site of procedure.  Tourniquet was placed and padded appropriately to the left upper arm.  First, we performed the irrigation and debridement of the fracture site.  There was notable exposed bone at the distal phalanx.  Laceration had occurred to both the ulnar and radial aspects of the distal pulp and longitudinal fashion, measuring approximately 1.5 cm in length on each side.  Copious irrigation was utilized, sharp debridement was performed of devitalized tissue.  Once were satisfied with the irrigation debridement, gentle reduction maneuver was performed of the left thumb distal phalanx.  This was performed under direct visualization and confirmed with biplanar fluoroscopy.  Once we were satisfied with our reduction, 0.062 K wire was introduced in retrograde fashion across the fracture site and across the IP joint.  Fluoroscopy was utilized to confirm intraosseous placement of the wire with appropriate stabilization and maintenance of the fracture reduction.  Once we were satisfied with the fracture reduction and wire placement, wire was bent and cut appropriately.  Laceration sites were closed utilizing 4-0 nylon in simple standard fashion.  Sterile dressings were applied followed by application of a thumb spica splint utilizing plaster.  The operative drapes were broken down.  The patient was awoken from anesthesia safely and taken to PACU in stable condition.   Post-operative plan: The patient will recover in the post-anesthesia care unit and then be discharged home.  The patient will be non weight bearing on the left upper extremity in a thumb spica splint.   I  will see the patient back in the office in 1 week for postoperative followup.  Discharge instructions were provided, discharge medications in the  form of oral antibiotics and pain medication was utilized as well.  Catalea Labrecque, MD Electronically signed, 02/01/24

## 2024-02-01 NOTE — ED Notes (Signed)
 Pt and pt wife starting to be disruptive and cause a scene in lobby. Management Samule and Warren made aware

## 2024-02-02 ENCOUNTER — Encounter (HOSPITAL_COMMUNITY): Payer: Self-pay | Admitting: Orthopedic Surgery

## 2024-02-02 ENCOUNTER — Telehealth: Payer: Self-pay

## 2024-02-02 NOTE — Telephone Encounter (Signed)
 Patients wife Odella called to schedule for F/U appt.  Patient had surgery on 02/02/2024.  Would like to know what other medications can patient take along with the Oxycodone .  Stated that patient is in pain.  CB# [phone].  Please advise.  Thank you

## 2024-02-02 NOTE — Telephone Encounter (Signed)
 Talked to wife and scheduled appointment for 12/2

## 2024-02-02 NOTE — Anesthesia Postprocedure Evaluation (Signed)
 Anesthesia Post Note  Patient: Brent Martin  Procedure(s) Performed: AMPUTATION, FINGER (Left)     Patient location during evaluation: PACU Anesthesia Type: General Level of consciousness: awake and alert Pain management: pain level controlled Vital Signs Assessment: post-procedure vital signs reviewed and stable Respiratory status: spontaneous breathing, nonlabored ventilation and respiratory function stable Cardiovascular status: stable and blood pressure returned to baseline Anesthetic complications: no   No notable events documented.  Last Vitals:  Vitals:   02/01/24 1800 02/01/24 1815  BP: 105/78 114/87  Pulse: 63 (!) 56  Resp: 19 16  Temp:  (!) 36.4 C  SpO2: 96% 95%    Last Pain:  Vitals:   02/01/24 1806  TempSrc:   PainSc: 3                  Debby FORBES Like

## 2024-02-09 ENCOUNTER — Ambulatory Visit: Payer: Self-pay

## 2024-02-09 ENCOUNTER — Encounter: Admitting: Rehabilitative and Restorative Service Providers"

## 2024-02-09 ENCOUNTER — Ambulatory Visit: Admitting: Orthopedic Surgery

## 2024-02-09 DIAGNOSIS — M79645 Pain in left finger(s): Secondary | ICD-10-CM

## 2024-02-09 NOTE — Progress Notes (Signed)
   Brent Martin - 53 y.o. male MRN [id number]  Date of birth: 11/24/1970  Office Visit Note: Visit Date: 02/09/2024 PCP: Bulah Alm RAMAN, PA-C Referred by: Bulah Alm RAMAN, PA-C  Subjective:  HPI: Brent Martin is a 52 y.o. male who presents today for follow up 1 week status post left thumb irrigation and debridement. Left thumb open reduction internal fixation with pinning distal phalanx fracture.  Pertinent ROS were reviewed with the patient and found to be negative unless otherwise specified above in HPI.   Assessment & Plan: Visit Diagnoses:  1. Pain of left thumb     Plan: Pin site remains clean dry, sutures in place with well-approximated skin, no evidence of ongoing infection.  Thumb spica splint was reapplied today.  Follow-up in 1 week for wound check and suture removal at that time.  Follow-up: No follow-ups on file.   Meds & Orders: No orders of the defined types were placed in this encounter.   Orders Placed This Encounter  Procedures   XR Finger Thumb Left     Procedures: No procedures performed       Objective:   Vital Signs: There were no vitals taken for this visit.  Ortho Exam Left thumb pin site clean dry and intact, thumb in appropriate alignment, sensation intact distally, normal color and capillary refill to the digit, sutures in place both ulnarly and radially with skin edges well-approximated, mild serosanguineous drainage  Imaging: XR Finger Thumb Left Result Date: 02/09/2024 X-rays of the left thumb demonstrate stable appearance of the distal phalanx fracture, transverse in nature, remains well reduced with appropriate pin site fixation.  No evidence of pin failure or migration.    Javone Ybanez Afton Alderton, M.D. [facility], Hand Surgery

## 2024-02-11 ENCOUNTER — Other Ambulatory Visit: Payer: Self-pay

## 2024-02-11 DIAGNOSIS — M79645 Pain in left finger(s): Secondary | ICD-10-CM

## 2024-02-11 NOTE — Therapy (Incomplete)
 OUTPATIENT OCCUPATIONAL THERAPY ORTHO EVALUATION  Patient Name: Brent Martin MRN: [id number] DOB:[birth year], 53 y.o., male Today's Date: 02/11/2024  PCP: Bulah, D. PA-C REFERRING PROVIDER: Dr Arlinda   END OF SESSION:   Past Medical History:  Diagnosis Date   Anxiety    Asthma    Chronic pain in left foot    Depression    Numbness of left foot    chronic as of 09/2014, left 2nd and 3rd MTP area   Psoriasis    Seasonal allergic rhinitis    Past Surgical History:  Procedure Laterality Date   AMPUTATION FINGER Left 02/01/2024   Procedure: AMPUTATION, FINGER;  Surgeon: Arlinda Buster, MD;  Location: [facility] OR;  Service: Orthopedics;  Laterality: Left;  REVISION AMPUTATION LEFT THUMB  POSSIBLE CRPP   HERNIA REPAIR     NO PAST SURGERIES  09/2014   Patient Active Problem List   Diagnosis Date Noted   Open displaced fracture of distal phalanx of left thumb 02/01/2024   Numbness of left foot 09/27/2014   Chronic foot pain 09/27/2014   Psoriasis 09/27/2014   Major depression 03/18/2013   Anxiety state 03/18/2013    ONSET DATE: DOS: 02/01/24  REFERRING DIAG: F20.354 (ICD-10-CM) - Pain of left thumb   THERAPY DIAG:  No diagnosis found.  Rationale for Evaluation and Treatment: Rehabilitation  SUBJECTIVE:   SUBJECTIVE STATEMENT: Now ~2 weeks s/p Lt thumb T2 fx and CRPP.  He states ***   Pt accompanied by: significant other  PERTINENT HISTORY: Pt had a dog bite his Lt thumb and fx his T2. Records do show that he was disruptive in the ED and aggressive toward staff there.   PRECAUTIONS: {Therapy precautions:24002}  RED FLAGS: {PT Red Flags:29287}   WEIGHT BEARING RESTRICTIONS: {Yes ***/No:24003}  PAIN:  Are you having pain? Yes: NPRS scale: *** Pain location: *** Pain description: *** Aggravating factors: *** Relieving factors: ***  FALLS: Has patient fallen in last 6 months? {fallsyesno:27318}  LIVING ENVIRONMENT: Lives with: {[facility] lives  with:25569::lives with their family} Lives in: {Lives in:25570} Stairs: {opstairs:27293} Has following equipment at home: {Assistive devices:23999}  PLOF: {PLOF:24004}  PATIENT GOALS: ***  NEXT MD VISIT: ***   OBJECTIVE: (All objective assessments below are from initial evaluation on: 02/15/24  unless otherwise specified.)   HAND DOMINANCE: Right ***  ADLs: Overall ADLs: States decreased ability to grab, hold household objects, pain and difficulty to open containers, perform FMS tasks (manipulate fasteners on clothing), mild to moderate bathing problems as well. ***   FUNCTIONAL OUTCOME MEASURES: Eval: Patient Specific Functional Scale: *** (***, ***, ***)  (Higher Score  =  Better Ability for the Selected Tasks)      UPPER EXTREMITY ROM     Shoulder to Wrist AROM Right eval Left eval  Forearm supination    Forearm pronation     Wrist flexion *** ***  Wrist extension *** ***  Wrist ulnar deviation    Wrist radial deviation    Functional dart thrower's motion (F-DTM) in ulnar flexion    F-DTM in radial extension     (Blank rows = not tested)   Hand AROM LT eval  Full Fist Ability (or Gap to Distal Palmar Crease) ***  Thumb Opposition  (Kapandji Scale)  ***/10 without DIP J motion  Thumb MCP (0-60) ***  Thumb IP (0-80) NT  Thumb Palmar Abduction Span ***  (Blank rows = not tested)   UPPER EXTREMITY MMT:    Eval:  NT at eval due to  recent and still healing injuries. Will be tested when appropriate.   MMT Lt TBD  Elbow flexion   Elbow extension   Forearm supination   Forearm pronation   Wrist flexion   Wrist extension   Wrist ulnar deviation   Wrist radial deviation   (Blank rows = not tested)  HAND FUNCTION: Eval: Observed weakness in affected Lt hand.  Grip strength Right: *** lbs, Left: *** lbs   COORDINATION: Eval: Observed coordination impairments with affected *** hand. Box and Blocks Test: *** Blocks today (*** is [facility]); 9 Hole Peg Test  Right: ***sec, Left: *** sec (*** sec is WFL)   SENSATION: Eval:  Light touch intact today,   *** though diminished around sx area    EDEMA:   Eval: *** Mildly swollen in *** hand and wrist today, ***cm circumferentially around ***  COGNITION: Eval: Overall cognitive status: WFL for evaluation today ***  OBSERVATIONS:   Eval: ***   TODAY'S TREATMENT:  Post-evaluation treatment: ***     PATIENT EDUCATION: Education details: See tx section above for details  Person educated: Patient Education method: Engineer, Structural, Teach back, Handouts  Education comprehension: States and demonstrates understanding, Additional Education required    HOME EXERCISE PROGRAM: See tx section above for details    GOALS: Goals reviewed with patient? Yes   SHORT TERM GOALS: (STG required if POC>30 days) Target Date: ***  Pt will obtain protective, custom orthotic. Goal status: TBD/PRN,  MET ***  2.  Pt will demo/state understanding of initial HEP to improve pain levels and prerequisite motion. Goal status: INITIAL   LONG TERM GOALS: Target Date: ***  Pt will improve functional ability by decreased impairment per PSFS assessment from *** to *** or better, for better quality of life. Goal status: INITIAL  2.  Pt will improve grip strength in *** hand from ***lbs to at least ***lbs for functional use at home and in IADLs. Goal status: INITIAL  3.  Pt will improve A/ROM in *** from *** to at least ***, to have functional motion for tasks like reach and grasp.  Goal status: INITIAL  4.  Pt will improve strength in *** from *** MMT to at least *** MMT to have increased functional ability to carry out selfcare and higher-level homecare tasks with less difficulty. Goal status: INITIAL  5.  Pt will improve coordination skills in ***, as seen by within functional limit score on *** testing to have increased functional ability to carry out fine motor tasks (fasteners, etc.) and more  complex, coordinated IADLs (meal prep, sports, etc.).  Goal status: INITIAL  6.  Pt will decrease pain at worst from ***/10 to ***/10 or better to have better sleep and occupational participation in daily roles. Goal status: INITIAL   ASSESSMENT:  CLINICAL IMPRESSION: Patient is a 53 y.o. male who was seen today for occupational therapy evaluation for stiffness, weakness, soreness, decreased coordination and functional use of the left hand after T2 fracture with percutaneous pinning and laceration repairs following a dog bite.  The patient will benefit from outpatient occupational therapy to decrease symptoms, improve functional upper extremity use, and increase quality of life.  PERFORMANCE DEFICITS: in functional skills including ADLs, IADLs, coordination, dexterity, proprioception, edema, ROM, strength, pain, flexibility, Fine motor control, body mechanics, endurance, decreased knowledge of precautions, and wound, cognitive skills including problem solving and safety awareness, and psychosocial skills including coping strategies, environmental adaptation, habits, and routines and behaviors.   IMPAIRMENTS: are limiting patient from  ADLs, IADLs, rest and sleep, and leisure.   COMORBIDITIES: may have co-morbidities  that affects occupational performance. Patient will benefit from skilled OT to address above impairments and improve overall function.  MODIFICATION OR ASSISTANCE TO COMPLETE EVALUATION: {OT modification:25474}  OT OCCUPATIONAL PROFILE AND HISTORY: {OT PROFILE AND HISTORY:25484}  CLINICAL DECISION MAKING: {OT CDM:25475}  REHAB POTENTIAL: {rehabpotential:25112}  EVALUATION COMPLEXITY: {Evaluation complexity:25115}      PLAN:  OT FREQUENCY: 1-2x/week  OT DURATION: 6 weeks through 04/01/24 and up to 10 total visits as needed   PLANNED INTERVENTIONS: 97535 self care/ADL training, 02889 therapeutic exercise, 97530 therapeutic activity, 97112 neuromuscular re-education,  97140 manual therapy, 97035 ultrasound, 97032 electrical stimulation (manual), 97760 Orthotic Initial, [id number] Orthotic/Prosthetic subsequent, compression bandaging, Dry needling, energy conservation, coping strategies training, and patient/family education  RECOMMENDED OTHER SERVICES: none now    CONSULTED AND AGREED WITH PLAN OF CARE: Patient  PLAN FOR NEXT SESSION:   Review initial HEP and recommendations, check orthosis and upgrade to gentle DIP J AROM at 3 weeks post-op if Dr Erwin clears it    Melvenia Ada, OTR/L, CHT  02/11/2024, 2:42 PM

## 2024-02-15 ENCOUNTER — Other Ambulatory Visit: Payer: Self-pay

## 2024-02-15 ENCOUNTER — Encounter: Admitting: Rehabilitative and Restorative Service Providers"

## 2024-02-15 ENCOUNTER — Ambulatory Visit: Admitting: Orthopedic Surgery

## 2024-02-15 ENCOUNTER — Encounter: Admitting: Orthopedic Surgery

## 2024-02-15 DIAGNOSIS — M79645 Pain in left finger(s): Secondary | ICD-10-CM

## 2024-02-15 NOTE — Progress Notes (Signed)
   Brent Martin - 53 y.o. male MRN [id number]  Date of birth: 1970/12/22  Office Visit Note: Visit Date: 02/15/2024 PCP: Bulah Alm RAMAN, PA-C Referred by: Bulah Alm RAMAN, PA-C  Subjective:  HPI: Brent Martin is a 53 y.o. male who presents today for follow up 2 weeks status post left thumb irrigation and debridement. Left thumb open reduction internal fixation with pinning distal phalanx fracture.  Doing well overall, pain is controlled.  Has been compliant with splinting as instructed.  Pertinent ROS were reviewed with the patient and found to be negative unless otherwise specified above in HPI.   Assessment & Plan: Visit Diagnoses:  1. Pain of left thumb     Plan: Doing well overall.  X-rays today demonstrate stable appearance of the left thumb distal phalanx fracture with appropriate pin site fixation.  Pin remains clean dry and intact.  Sutures are well-healing and can be removed today.  Transition to a thumb spica cast for additional 2 weeks.  Follow-up at that juncture for repeat clinical and radiographic check, likely pin removal at that time.  Follow-up: No follow-ups on file.   Meds & Orders: No orders of the defined types were placed in this encounter.   Orders Placed This Encounter  Procedures   XR Finger Thumb Left     Procedures: No procedures performed       Objective:   Vital Signs: There were no vitals taken for this visit.  Ortho Exam Left thumb, pin remains clean and dry, site without erythema or drainage, sutures well-approximated skin without erythema or drainage  Imaging: XR Finger Thumb Left Result Date: 02/15/2024 X-ray left thumb demonstrate stable appearance of distal phalanx fracture, pin remains well fixated in all planes without evidence of failure or migration    Aixa Corsello Estela) Arlinda, M.D. [facility], Hand Surgery

## 2024-02-25 NOTE — Progress Notes (Unsigned)
° °  Brent Martin - 53 y.o. male MRN [id number]  Date of birth: 1970/06/17  Office Visit Note: Visit Date: 02/29/2024 PCP: Bulah Alm RAMAN, PA-C Referred by: Bulah Alm RAMAN, PA-C  Subjective:  HPI: Brent Martin is a 53 y.o. male who presents today for follow up 4 weeks status post left thumb irrigation and debridement. Left thumb open reduction internal fixation with pinning distal phalanx fracture.  Pertinent ROS were reviewed with the patient and found to be negative unless otherwise specified above in HPI.   Assessment & Plan: Visit Diagnoses: No diagnosis found.  Plan: ***  Follow-up: No follow-ups on file.   Meds & Orders: No orders of the defined types were placed in this encounter.  No orders of the defined types were placed in this encounter.    Procedures: No procedures performed       Objective:   Vital Signs: There were no vitals taken for this visit.  Ortho Exam ***  Imaging: No results found.   Anshul Afton Alderton, M.D. [facility], Hand Surgery

## 2024-02-29 ENCOUNTER — Ambulatory Visit: Admitting: Orthopedic Surgery

## 2024-02-29 ENCOUNTER — Other Ambulatory Visit: Payer: Self-pay

## 2024-02-29 DIAGNOSIS — M79645 Pain in left finger(s): Secondary | ICD-10-CM

## 2024-03-28 ENCOUNTER — Ambulatory Visit: Admitting: Orthopedic Surgery

## 2024-03-28 NOTE — Progress Notes (Unsigned)
" ° °  Brent Martin - 54 y.o. male MRN [id number]  Date of birth: 1971-02-04  Office Visit Note: Visit Date: 03/28/2024 PCP: Bulah Alm RAMAN, PA-C Referred by: Bulah Alm RAMAN, PA-C  Subjective:  HPI: Brent Martin is a 54 y.o. male who presents today for follow up 8 weeks status post left thumb irrigation and debridement. Left thumb open reduction internal fixation with pinning distal phalanx fracture. He is overall doing well.   Pertinent ROS were reviewed with the patient and found to be negative unless otherwise specified above in HPI.   Assessment & Plan: Visit Diagnoses: No diagnosis found.  Plan: ***  Follow-up: No follow-ups on file.   Meds & Orders: No orders of the defined types were placed in this encounter.  No orders of the defined types were placed in this encounter.    Procedures: No procedures performed       Objective:   Vital Signs: There were no vitals taken for this visit.  Ortho Exam ***  Imaging: No results found.   Brent Martin, M.D. [facility], Hand Surgery  "
